# Patient Record
Sex: Female | Born: 1970 | Race: White | Hispanic: No | Marital: Married | Smoking: Never smoker
Health system: Southern US, Community
[De-identification: ages and names within clinical notes are randomized; demographics above are authoritative.]

## PROBLEM LIST (undated history)

## (undated) ENCOUNTER — Inpatient Hospital Stay (HOSPITAL_COMMUNITY): Payer: Self-pay

## (undated) DIAGNOSIS — E669 Obesity, unspecified: Secondary | ICD-10-CM

## (undated) DIAGNOSIS — Z8041 Family history of malignant neoplasm of ovary: Secondary | ICD-10-CM

## (undated) DIAGNOSIS — IMO0002 Reserved for concepts with insufficient information to code with codable children: Secondary | ICD-10-CM

## (undated) DIAGNOSIS — R87619 Unspecified abnormal cytological findings in specimens from cervix uteri: Secondary | ICD-10-CM

## (undated) HISTORY — PX: OTHER SURGICAL HISTORY: SHX169

## (undated) HISTORY — DX: Family history of malignant neoplasm of ovary: Z80.41

---

## 2003-01-13 ENCOUNTER — Other Ambulatory Visit: Admission: RE | Admit: 2003-01-13 | Discharge: 2003-01-13 | Payer: Self-pay | Admitting: Obstetrics & Gynecology

## 2005-05-14 ENCOUNTER — Encounter: Admission: RE | Admit: 2005-05-14 | Discharge: 2005-06-01 | Payer: Self-pay | Admitting: General Surgery

## 2005-05-14 ENCOUNTER — Ambulatory Visit (HOSPITAL_COMMUNITY): Admission: RE | Admit: 2005-05-14 | Discharge: 2005-05-14 | Payer: Self-pay | Admitting: General Surgery

## 2005-05-28 ENCOUNTER — Ambulatory Visit (HOSPITAL_COMMUNITY): Admission: RE | Admit: 2005-05-28 | Discharge: 2005-05-28 | Payer: Self-pay | Admitting: General Surgery

## 2005-06-02 HISTORY — PX: LAPAROSCOPIC GASTRIC BANDING: SHX1100

## 2005-09-26 ENCOUNTER — Encounter: Admission: RE | Admit: 2005-09-26 | Discharge: 2005-10-30 | Payer: Self-pay | Admitting: General Surgery

## 2005-10-13 ENCOUNTER — Ambulatory Visit (HOSPITAL_COMMUNITY): Admission: RE | Admit: 2005-10-13 | Discharge: 2005-10-14 | Payer: Self-pay | Admitting: General Surgery

## 2005-12-04 ENCOUNTER — Encounter: Admission: RE | Admit: 2005-12-04 | Discharge: 2006-01-15 | Payer: Self-pay | Admitting: General Surgery

## 2006-03-12 ENCOUNTER — Encounter: Admission: RE | Admit: 2006-03-12 | Discharge: 2006-06-10 | Payer: Self-pay | Admitting: General Surgery

## 2006-06-11 ENCOUNTER — Encounter: Admission: RE | Admit: 2006-06-11 | Discharge: 2006-09-09 | Payer: Self-pay | Admitting: General Surgery

## 2006-10-19 ENCOUNTER — Encounter: Admission: RE | Admit: 2006-10-19 | Discharge: 2006-10-19 | Payer: Self-pay | Admitting: General Surgery

## 2006-10-29 ENCOUNTER — Ambulatory Visit (HOSPITAL_COMMUNITY): Admission: RE | Admit: 2006-10-29 | Discharge: 2006-10-29 | Payer: Self-pay | Admitting: General Surgery

## 2007-06-03 HISTORY — PX: CHOLECYSTECTOMY: SHX55

## 2007-06-24 ENCOUNTER — Inpatient Hospital Stay (HOSPITAL_COMMUNITY): Admission: EM | Admit: 2007-06-24 | Discharge: 2007-06-27 | Payer: Self-pay | Admitting: Emergency Medicine

## 2007-06-25 ENCOUNTER — Encounter (INDEPENDENT_AMBULATORY_CARE_PROVIDER_SITE_OTHER): Payer: Self-pay | Admitting: General Surgery

## 2007-11-16 ENCOUNTER — Encounter: Admission: RE | Admit: 2007-11-16 | Discharge: 2007-11-16 | Payer: Self-pay | Admitting: Surgery

## 2010-10-15 NOTE — Op Note (Signed)
Lauren Austin, Lauren Austin               ACCOUNT NO.:  [id number]   MEDICAL RECORD NO.:  [id number]          PATIENT TYPE:  AMB   LOCATION:  DAY                          FACILITY:  [facility]   PHYSICIAN:  Sharlet Salina T. Hoxworth, M.D.DATE OF BIRTH:  May 25, 1971   DATE OF PROCEDURE:  10/29/2006  DATE OF DISCHARGE:                               OPERATIVE REPORT   PREOPERATIVE DIAGNOSIS:  Inability to access lap band port, apparent  flipped port.   POSTOPERATIVE DIAGNOSIS:  Ports apparently in good position.   SURGICAL PROCEDURES:  Exploration of lap band port.   SURGEON:  Lorne Skeens. Hoxworth, M.D.   ANESTHESIA:  General.   BRIEF HISTORY:  Lauren Austin is a 40 year old female status post lap  band placement for morbid obesity approximately eight months ago.  She  has had good weight loss with 80 pounds total weight loss so far.  However, on two previous visits to the office, I have been unable to  access her port after multiple attempts.  I obtained a KUB which appears  to show that the port has flipped 180 degrees facing posteriorly.  As I  am unable to access her port for fills, I have recommended exploration  in the operating room and repositioning of the port.   DESCRIPTION OF PROCEDURE:  The patient was brought to the operating room  and placed in a supine position on the operating table and general  anesthesia induced.  She received preoperative antibiotics.  The abdomen  was sterilely prepped and draped.  The correct patient and procedure  were verified.  The small transverse scar in the right mid abdomen was  excised and dissection carried down through the subcutaneous tissue  using cautery.  I could carry dissection directly down on the port which  was palpable.  The port was oriented facing a little toward the medial  abdomen rather than typically angled toward the lateral abdomen, but did  appear to be securely fastened to the anterior fascia and in an  essentially normal  anterior posterior orientation, again other than  being angled slightly medially.  It certainly did not, however, appear  to be flipped 180 degrees or be unstable in anyway.   The port was positioned under the somewhat medial aspect of the incision  and with a slight medial tilt.  I suspect that access from the lateral  aspect of the incision made the angle difficult resulting in it being  unable to access.  With this finding, I did not feel that any formal re-  suturing of the port to the abdominal wall was indicated.  As discussed  with the patient, I accessed the port in the operating room directly.  It contained 2 mL of fluid as expected and I added an additional 0.25 mL  to bring her up to 2.25 mL.  The wound was irrigated and hemostasis  assured.  The subcu was infiltrated with Marcaine.  The skin was closed  with staples.  Sponge and needle counts were correct.  The patient  tolerated the procedure well and was taken to the recovery room in  good  condition.     Lorne Skeens. Hoxworth, M.D.  Electronically Signed    BTH/MEDQ  D:  10/29/2006  T:  10/29/2006  Job:  [id number]

## 2010-10-15 NOTE — Op Note (Signed)
Lauren Austin               ACCOUNT NO.:  [id number]   MEDICAL RECORD NO.:  [id number]          PATIENT TYPE:  INP   LOCATION:  1525                         FACILITY:  [facility]   PHYSICIAN:  Sharlet Salina T. Austin, M.D.DATE OF BIRTH:  1970-12-24   DATE OF PROCEDURE:  06/25/2007  DATE OF DISCHARGE:                               OPERATIVE REPORT   PREOPERATIVE DIAGNOSIS:  Cholelithiasis and possible  choledocholithiasis.   POSTOPERATIVE DIAGNOSIS:  Cholelithiasis and choledocholithiasis.   SURGICAL PROCEDURES:  Laparoscopic cholecystectomy and balloon catheter  common bile duct exploration.   SURGEON:  Dr. Johna Sheriff   ASSISTANT:  Dr. Felicity Pellegrini   ANESTHESIA:  General.   BRIEF HISTORY:  Lauren Austin is a 40 year old female who is status post  lap band surgery with approximately 80 pounds of weight loss.  She  presents with a several-day history of epigastric severe abdominal pain,  and workup has included a gallbladder ultrasound showing multiple  gallstones and a borderline dilated common bile duct.  She has had  elevated LFTs with a bilirubin 5 on presentation and alk phos about 180.  After overnight hospitalization, her pain has resolved and bilirubin is  down to 3, although remainder of her other liver enzymes are about the  same.  I have recommended at this point proceeding with laparoscopic  cholecystectomy with intraoperative cholangiogram.  We discussed  possible management of common bile duct stones including pre or  postoperative ERCP and have elected to planned postoperative ERCP if she  has common bile duct stones that could not be removed laparoscopically.  The nature of the procedure, the risks of bleeding, infection, common  bile duct injury, bile leak, possibility for open procedure or  postoperative ERCP were discussed understood.  She is now brought to the  operating room for this procedure.   DESCRIPTION OF OPERATION:  The patient brought to the operating  room and  placed in supine position on the operating table, and general  orotracheal anesthesia was induced.  The abdomen was widely sterilely  prepped and draped.  She was already on IV antibiotics.  Lovenox 40 mg  was given subcutaneously.  Local anesthesia was used to the to  infiltrate the trocar sites after correct patient and procedure were  verified.  A 1 cm incision was made above the umbilicus and dissection  carried down the midline fascia which was sharply incised for 1 cm and  the peritoneum entered under direct vision.  Through a mattress sutures  of 0 Vicryl, the Hasson trocar was placed and pneumoperitoneum  established.  There were a few adhesions of omentum up to the anterior  abdominal wall that were taken down through a 5 mm trocar placed  laterally, and then an 11 mm trocar was placed in the subxiphoid area  and another 5 mm trocar laterally.  The gallbladder was slightly  distended with some mild chronic inflammation.  The fundus was grasped,  and omental adhesions were taken down off the gallbladder, working down  toward the infundibulum which was then grasped and retracted  inferolaterally.  Further fibrofatty tissue was stripped  off the neck of  the gallbladder toward the porta hepatis.  Peritoneum anterior and  posterior to Calot's triangle was dissected and Calot's triangle  thoroughly dissected.  The gallbladder was seen to taper down nicely to  a slightly dilated cystic duct.  The cystic artery was clearly  identified coursing upon the gallbladder wall and Calot's triangle was  divided between two proximal and distal clip.  This cystic gallbladder  junction was dissected 360 degrees and the cystic duct dissected out  over about 1.5 cm.  When the anatomy appeared clear, the cystic duct was  clipped at the gallbladder junction and operative cholangiogram obtained  through the cystic duct.  This showed good filling of a mildly dilated  common bile duct and  intrahepatic ducts, and there was flow down to the  ampulla but a fairly abrupt cut off of the meniscus and no flow into the  duodenum.  At this point, I elected to try to clear the common duct with  a balloon catheter.  A #4 Fogarty catheter was introduced through one of  the lateral trocars after removal of the cholangiocatheter was inserted  through the cystic duct and was able be passed easily down through the  common duct and ampulla.  The balloon was inflated and the catheter  gently retracted back, and I retrieved and intact several mm gallstone  through the cystic duct.  We made another pass in a similar fashion with  the Fogarty catheter, and no further stones or debris were retrieved.  Following this, the cholangiogram was repeated, and this time there was  free flow into the duodenum.  There appeared to be a little bit of a  filling defect right at the ampulla, but this was consistent possibly  with a little sludge or edematous mucosa and not a definite stone.  At  this point, the cholangiocath was removed, and the cystic duct was  triply clipped proximally and divided.  The gallbladder was then  dissected free from its bed using hook cautery, removed intact, placed  in a bag and brought out through the midline incision.  The right upper  quadrant was thoroughly irrigated and complete hemostasis assured.  Trocars removed under direct vision, all CO2 was evacuated.  Mattress  suture was secured at the supraumbilical incision.  Skin incisions were  closed with interrupted subcuticular Monocryl and Dermabond.  The  patient taken to the recovery room in good condition.      Lauren Austin, M.D.  Electronically Signed     BTH/MEDQ  D:  06/25/2007  T:  06/26/2007  Job:  [id number]

## 2010-10-15 NOTE — H&P (Signed)
NAMECRISTIE, Austin               ACCOUNT NO.:  [id number]   MEDICAL RECORD NO.:  [id number]          PATIENT TYPE:  INP   LOCATION:  1525                         FACILITY:  [facility]   PHYSICIAN:  Ardeth Sportsman, MD     DATE OF BIRTH:  22-Feb-1971   DATE OF ADMISSION:  06/24/2007  DATE OF DISCHARGE:                              HISTORY & PHYSICAL   PRIMARY CARE PHYSICIAN:  Dr. Lynett Grimes   SURGEON:  Lorne Skeens. Hoxworth, M.D.   GASTROENTEROLOGIST:  [facility]   CHIEF COMPLAINT:  Epigastric pain and jaundice.   HISTORY OF PRESENT ILLNESS:  [name] is a 40 year old morbidly obese  female who is status post laparoscopic-adjusted gastric banding by Dr.  Glenna Fellows on 13 Oct 2005.  He has been following her, and she  has had a pretty good course except for operation for a slightly  malpositioned band.  She has lost 70 pounds since surgery.  She denies  any fevers, chills, sweats or sick contacts.  However, she notes for the  past four days she has been having episodes where she started having  severe epigastric/subxiphoid pain that was rather burning and deep.  She  felt nauseated.  She almost dry heaved but has not thrown up.  She has  had similar episodes about three or four times in the past year that are  much milder than this.  The pain intensified, so she came to the  emergency room.  Evaluation was concerning for elevated bilirubin around  5.  Given her concerns, she was seen in evaluation for probable  choledocholithiasis secondary to gall stone pancreatitis.  Admission was  requested for Dr. Johna Sheriff.  He is not available, so he asked me to  evaluate the patient for him until he is more available in the next 24  hours.   The patient denies any sick contacts or travel history.  She otherwise  has pretty good exercise tolerance and denies any exertional chest pain  or shortness of breath.  The pain does not seem to be worse with  coughing.  No productive  sputum.   PAST MEDICAL HISTORY:  1. Morbid obesity.  2. 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  3. Hypertension.  4. Depression.  5. Mild anxiety.   PAST SURGICAL HISTORY:  1. Laparoscopic adjustable gastric banding on 10/13/2005.  2. Exploration of port site on 29 Oct 2006.   ALLERGIES:  NONE.   MEDICATIONS:  She seems to be on Wellbutrin as well as birth control  pills, Lisinopril, Zoloft and Wellbutrin.   SOCIAL HISTORY:  No tobacco, alcohol or drug use.  She is here today  with her mother.   FAMILY HISTORY:  Her father has hypertension and diabetes, but her  mother and sister are doing well, but they are otherwise healthy.   REVIEW OF SYSTEMS:  Noted per chart.  Otherwise, constitutional,  ophthamalogic, ENT, cardiac, respiratory, renal, endocrine, negative.  Musculoskeletal:  She has some mild joint pain that has not improved  since losing 70 pounds from her gastric bypass surgery.  GI:  She has  some mild dysphagia with her banding, but that is appropriate.  She has  not had to have any recent fills or change.  She feels full and that has  not changed recently.  She has not had some increased intake of food.  She has a bowel movement every day.  Hepatic:  She has noted her urine  has been getting a little darker.  No severe itching, mild jaundice.  Psych:  Mild depression, anxiety that have been well controlled and  stable.  No recent mood change or swings.  No hallucinations or  psychoses.  Neurologic & allergic are otherwise negative.   PHYSICAL EXAMINATION:  VITAL SIGNS:  Her vital signs are noted in the  chart, but they are within the normal range.  GENERAL:  She is a well-developed, well-nourished obese female, lying in  bed, comfortable, not frankly toxic.  PSYCH:  She is pleasant & at least average intelligence, no evidence of  dementia, delerium, psychosis, or paranoia.  She does not seem  particularly anxious or depressed.   NEUROLOGIC:  Cranial nerves II-XII are intact.  Hand grip is 5/5,  symmetrical.  There are no intension tremors.  Eyes:  Pupils equal,  round, reactive to light, extraocular movements are intact.  Sclera are  slightly icteric but not injected.  HEENT:  She is normocephalic.  Mucous membranes are moist.  Nasopharynx  and oropharynx are clear.  NECK:  Supple, no masses, trachea is midline.  HEART:  Regular rate and rhythm, no murmurs, rubs or gallops.  CHEST:  Clear to auscultation bilaterally.  No wheezes, rales rhonchi.  No pain to reverse sternal compression.  ABDOMEN:  Soft, her port can be felt.  It does not seem to be infected.  She has some mild xiphoid discomfort but not really significant  discomfort in the right upper quadrant, no classic Murphy sign.  MUSCULOSKELETAL:  Full range of motion in her shoulders, elbows, wrists  as well as hips, knees and ankles, and entire spine.  LYMPHS:  No head, neck, axillary or groin lymphadenopathy.  BREASTS AND RECTAL:  Deferred at this time.   LABORATORY DATA:  White count is 4.2, his neutrophils are 80%.  Her  hemoglobin is 13.3.  Comprehensive metabolic panel:  Her basic  electrolytes are in the normal range except for her total bilirubin was  5.3 and alkaline phosphatase was 183.  AST and ALT are 99 and 120,  slightly evaluated.  Her albumin is 3.2.  Lipase is slightly elevated at  67.  Urine pregnancy was negative.  Urinalysis was negative.  X-ray  shows no evidence of any pneumonia or effusions.  Her band seems to be  in good positioning.   ASSESSMENT/PLAN:  Morbidly obese female a year and a half status post  distal gastric banding with 70-pound weight loss with xiphoid pain, no  postprandial concerning for probable biliary component with evidence of  pancreatitis and probable choledocholithiasis.  Will admit to IV fluids.  1. Ultrasound documented presence of stones and perhaps a common bile      duct stone.  2. GI consultation for  elevated liver function tests, question of need      of ERCP.  Perhaps just observing and seeing if her numbers go down      tomorrow to avoid an ERCP versus intervening sooner if this      worsens.  She is not particularly toxic.  3. I will hold off on any antibiotics at  this time.  4. Pancreatitis .  NPO on IV fluids for now.  5. Dr. Johna Sheriff will follow up on patient.  6. Hypertension.  Will monitor for right now.  7. Depression stable, anxiety stable.      Ardeth Sportsman, MD  Electronically Signed     SCG/MEDQ  D:  06/24/2007  T:  06/24/2007  Job:  [id number]

## 2010-10-18 NOTE — Op Note (Signed)
NAMESIAN, ROCKERS               ACCOUNT NO.:  [id number]   MEDICAL RECORD NO.:  [id number]          PATIENT TYPE:  OIB   LOCATION:  1616                         FACILITY:  [facility]   PHYSICIAN:  Sharlet Salina T. Hoxworth, M.D.DATE OF BIRTH:  05-06-1971   DATE OF PROCEDURE:  10/13/2005  DATE OF DISCHARGE:                                 OPERATIVE REPORT   POSTOPERATIVE DIAGNOSIS:  Morbid obesity.   SURGICAL PROCEDURES:  Laparoscopic adjustable gastric band placement.   SURGEON:  Lorne Skeens. Hoxworth, M.D.   ASSISTANT:  Baruch Merl, M.D.   ANESTHESIA:  General.   BRIEF HISTORY:  Lauren Austin is a 40 year old female with progressive  morbid obesity, now with a BMI of approximately 59 and multiple  comorbidities.  She has unable to sustain nonsurgical weight loss and  following an extensive preoperative workup and discussion detailed  elsewhere, we have elected proceed with laparoscopic adjustable gastric band  placement.   DESCRIPTION OF PROCEDURE:  The patient was brought to the operating room and  placed in  supine position on the operating table and general endotracheal  anesthesia was induced.  She received preoperative IV antibiotics.  Lovenox  40 mg was given subcutaneously.  PAS were placed.  The abdomen was widely  sterilely prepped and draped.  Correct patient and procedure were verified.  Trocar sites were infiltrated with local anesthesia.  Access was obtained  through a 1-cm incision using the OptiVu trocar in the left upper quadrant  and pneumoperitoneum established.  Under direct vision, a camera port trocar  was placed just to the left of midline above the umbilicus.  There were  noted to be some adhesions of omentum to the anterior abdominal wall in the  upper abdomen, although the patient had no previous surgery.  These were  taken down easily with scissor dissection under direct vision.  Following  this, a 12-mm trocar was placed in the right upper abdomen just  below the  falciform ligament, an 11-mm trocar in the right upper mid-abdomen.  Through  a 5-mm site, the [facility] retractor was placed in the epigastrium and the left  lobe of liver elevated with very good exposure of the upper stomach and  hiatus.  A 5-mm trocar was placed in the left flank under the direct vision.  Following this, using cautery, the peritoneum at the angle of His was  incised, leaving some lateral attachments and used the finger dissector,  blunt dissection was carried down along the left crus toward the  retrogastric space without difficulty.  Following this, the gastrohepatic  omentum was divided in an avascular area and the right crus exposed and the  base of the right crus with crossing fat identified.  The retroperitoneum  was incised here with cautery and with careful blunt dissection, the finger  dissector was able to be easily passed in the retrogastric space and  deployed up through the previously dissected area at the angle of His  without any difficulty.  Following this, a 10 lap band system was introduced  into the abdomen, the tubing placed through the finger dissector and  brought  back around posterior to the stomach and the band brought back around  through the retrogastric space without any difficulty.  The calibration tube  was placed into the stomach and the tubing placed through the buckle in the  band and it was snapped into place and seemed not to be under any undue  tension.  The calibration tube was removed.  With the tubing held toward the  patient's feet, the fundus just below the angle of His was imbricated up  over the band to the small gastric pouch with 3 interrupted sutures  of 2-0  Ethibond.  The band was seen to rotate be in good position.  Following this,  the tubing was brought out through the right mid-abdominal trocar site, the  [facility] retractor was removed and all CO2 evacuated and trocars removed.  This incision was extended about 3 cm  laterally and the anterior fascia  exposed.  Four fixation sutures of 2-0 Prolene were placed, passed through  the port, which had been attached to the band tubing, the tubing introduced  into the abdomen and the port secured with these sutures.  The port was  noted to lie at about the midpoint of the incision about a centimeter  inferior to it.  Wounds were irrigated and subcu was closed at the port site  with running of 3-0 Vicryl and the skin closed with staples.  Sponge, needle  and instrument counts were correct.  Dry sterile dressing were applied and  the patient taken to the recovery room in good condition.      Lorne Skeens. Hoxworth, M.D.  Electronically Signed     BTH/MEDQ  D:  10/13/2005  T:  10/14/2005  Job:  [id number]

## 2010-10-18 NOTE — Discharge Summary (Signed)
NAMESAMARIYAH, COWLES               ACCOUNT NO.:  [id number]   MEDICAL RECORD NO.:  [id number]          PATIENT TYPE:  INP   LOCATION:  1525                         [facility]AC[facility]   PHYSICIAN:  Sharlet Salina T. Hoxworth, M.D.DATE OF BIRTH:  November 21, 1970   DATE OF ADMISSION:  06/24/2007  DATE OF DISCHARGE:  06/27/2007                               DISCHARGE SUMMARY   DISCHARGE DIAGNOSIS:  Cholelithiasis and choledocholithiasis.   OPERATIONS AND PROCEDURES:  Laparoscopic cholecystectomy with balloon  catheter, common bile duct exploration June 25, 2007.   HISTORY OF PRESENT ILLNESS:  Ms. Tildon Husky is a 40 year old female with a  history of lap band for morbid obesity and hypertension.  She has had  significant weight loss.  She now presents with persistent epigastric  abdominal pain, nausea and vomiting over several days.  She has no  history of any previous similar symptoms.   PAST MEDICAL HISTORY:  Significant for morbid obesity and hypertension.  Previous surgery includes lap band surgery.   MEDICATIONS ON ADMISSION:  Wellbutrin, birth control pills, lisinopril,  Zoloft, Wellbutrin.   ALLERGIES:  None.   Social history, family history, review of systems:  See detailed H&P.   PERTINENT PHYSICAL EXAMINATION:  GENERAL:  A mildly obese white female  in no acute distress.  ABDOMEN:  Showed well-healed incisions, mild  tenderness in the epigastrium.   LABORATORY:  Bilirubin was 5.3, alkaline phosphatase 183, AST, ALT 99  and 120 respectively.  Ultrasound showed gallstones, questionable common  bile duct stone.   HOSPITAL COURSE:  The patient was admitted, was started on pain  medicine, IV fluids.  On January 23, she was pain-free.  LFTs were down  somewhat.  Bilirubin 3, alkaline phosphatase 182, OT, PT 86, 134  respectively.  I recommended proceeding with laparoscopic  cholecystectomy with cholangiogram.  This was performed on January 23.  Cholangiogram showed a single distal common  bile duct stone, which was  able to be retrieved with a balloon catheter, and subsequent  cholangiogram showed no obstruction or stones.  Postoperatively she had  only mild surgical-type discomfort.  However, on January 24, LFTs were  increased, bilirubin 4.2, alk phosphatase 236, transaminases 133, 194  respectively.  As she was entirely asymptomatic, I elected to observe  this, and on the 25th, LFTs were somewhat improved, bilirubin 3.8,  alk  phos 264.  She was having no pain.  Tolerating a diet.  Abdomen  was soft and nontender.  With LFTs trending down, normal cholangiogram  and a clinically well patient, I elected to discharge her at this time  and follow LFTs in the office in 1 week.  She will call for any  recurrent pain.  Final pathology confirmed cholelithiasis and  cholecystitis.      Lorne Skeens. Hoxworth, M.D.  Electronically Signed     BTH/MEDQ  D:  07/15/2007  T:  07/16/2007  Job:  [id number]

## 2011-02-20 LAB — COMPREHENSIVE METABOLIC PANEL
ALT: 129 — ABNORMAL HIGH
ALT: 134 — ABNORMAL HIGH
AST: 86 — ABNORMAL HIGH
AST: 99 — ABNORMAL HIGH
Albumin: 2.8 — ABNORMAL LOW
Albumin: 3.2 — ABNORMAL LOW
Alkaline Phosphatase: 182 — ABNORMAL HIGH
Alkaline Phosphatase: 183 — ABNORMAL HIGH
BUN: 1 — ABNORMAL LOW
BUN: 5 — ABNORMAL LOW
CO2: 23
CO2: 28
Calcium: 8.6
Calcium: 9
Chloride: 104
Chloride: 104
Creatinine, Ser: 0.59
Creatinine, Ser: 0.66
GFR calc Af Amer: >60
GFR calc Af Amer: >60
GFR calc non Af Amer: >60
GFR calc non Af Amer: >60
Glucose, Bld: 107 — ABNORMAL HIGH
Glucose, Bld: 109 — ABNORMAL HIGH
Potassium: 4.2
Potassium: 4.3
Sodium: 135
Sodium: 138
Total Bilirubin: 3 — ABNORMAL HIGH
Total Bilirubin: 5.3 — ABNORMAL HIGH
Total Protein: 5.8 — ABNORMAL LOW
Total Protein: 6.5

## 2011-02-20 LAB — HEPATIC FUNCTION PANEL
ALT: 194 — ABNORMAL HIGH
AST: 133 — ABNORMAL HIGH
Albumin: 2.8 — ABNORMAL LOW
Alkaline Phosphatase: 236 — ABNORMAL HIGH
Alkaline Phosphatase: 264 — ABNORMAL HIGH
Bilirubin, Direct: 2.7 — ABNORMAL HIGH
Indirect Bilirubin: 1.5 — ABNORMAL HIGH
Indirect Bilirubin: 1.6 — ABNORMAL HIGH
Total Bilirubin: 4.2 — ABNORMAL HIGH
Total Protein: 6
Total Protein: 6

## 2011-02-20 LAB — CBC
HCT: 35.9 — ABNORMAL LOW
HCT: 35.9 — ABNORMAL LOW
HCT: 38.1
Hemoglobin: 12.5
Hemoglobin: 12.6
Hemoglobin: 13.3
MCHC: 34.9
MCHC: 34.9
MCHC: 35
MCV: 92
MCV: 92.5
Platelets: 172
Platelets: 179
RBC: 3.88
RBC: 4.15
RDW: 13.1
RDW: 13.2
RDW: 13.4
WBC: 3.2 — ABNORMAL LOW
WBC: 4.2

## 2011-02-20 LAB — URINALYSIS, ROUTINE W REFLEX MICROSCOPIC
Glucose, UA: NEGATIVE
Ketones, ur: 15 — AB
Leukocytes, UA: NEGATIVE
Nitrite: NEGATIVE
Protein, ur: NEGATIVE
Specific Gravity, Urine: 1.016
Urobilinogen, UA: 0.2
pH: 7.5

## 2011-02-20 LAB — DIFFERENTIAL
Basophils Absolute: 0
Lymphocytes Relative: 13
Lymphs Abs: 0.5 — ABNORMAL LOW
Monocytes Absolute: 0.3
Monocytes Relative: 7
Neutro Abs: 3.4

## 2011-02-20 LAB — LIPASE, BLOOD
Lipase: 26
Lipase: 67 — ABNORMAL HIGH

## 2011-02-20 LAB — URINE MICROSCOPIC-ADD ON

## 2011-03-06 ENCOUNTER — Telehealth (INDEPENDENT_AMBULATORY_CARE_PROVIDER_SITE_OTHER): Payer: Self-pay

## 2011-03-06 NOTE — Telephone Encounter (Signed)
I called patient to let her know Lauren Austin cancelled his afternoon office tomorrow 03/07/11. I left message to call back so I can move her up to morning slot or reschedule her for another day.

## 2011-03-21 ENCOUNTER — Encounter (INDEPENDENT_AMBULATORY_CARE_PROVIDER_SITE_OTHER): Payer: Self-pay

## 2011-03-21 ENCOUNTER — Ambulatory Visit (INDEPENDENT_AMBULATORY_CARE_PROVIDER_SITE_OTHER): Payer: Managed Care, Other (non HMO) | Admitting: Physician Assistant

## 2011-03-21 VITALS — BP 132/80 | HR 68 | Temp 97.2°F | Resp 14 | Ht 62.0 in | Wt 287.8 lb

## 2011-03-21 DIAGNOSIS — Z4651 Encounter for fitting and adjustment of gastric lap band: Secondary | ICD-10-CM

## 2011-03-21 NOTE — Progress Notes (Signed)
  HISTORY: Lauren Austin is a 40 y.o.female who received an 10cm lap-band in May 2007 by Dr. Johna Sheriff. She comes in complaining of increasing hunger and portion sizes. She denies vomiting or regurgitation symptoms. She would like to lose 40 lbs in the next 6 months.  VITAL SIGNS: Filed Vitals:   03/21/11 0909  BP: 132/80  Pulse: 68  Temp: 97.2 F (36.2 C)  Resp: 14    PHYSICAL EXAM: Physical exam reveals a very well-appearing 40 y.o.female in no apparent distress Neurologic: Awake, alert, oriented Psych: Bright affect, conversant Respiratory: Breathing even and unlabored. No stridor or wheezing Abdomen: Soft, nontender, nondistended to palpation. Incisions well-healed. No incisional hernias. Port easily palpated. Extremities: Atraumatic, good range of motion.  ASSESMENT: 40 y.o.  female  s/p 10cm lap-band.   PLAN: The patient's port was accessed with a 20G Huber needle without difficulty. Clear fluid was aspirated and 0.25 mL saline was added to the port to give a total predicted volume of 3.5 mL. The patient was able to swallow water without difficulty following the procedure and was instructed to take clear liquids for the next 24-48 hours and advance slowly as tolerated.

## 2011-03-21 NOTE — Patient Instructions (Signed)
Take clear liquids for the next 48 hours. Thin protein shakes are ok to start on Saturday evening. Call us if you have persistent vomiting or regurgitation, night cough or reflux symptoms. Return as scheduled or sooner if you notice no changes in hunger/portion sizes.   

## 2011-05-06 ENCOUNTER — Other Ambulatory Visit: Payer: Self-pay | Admitting: Obstetrics & Gynecology

## 2011-06-03 NOTE — L&D Delivery Note (Signed)
Patient was C/C/+2 and pushed to +3 for about 20 minutes with epidural.   FHTs began to have decels to 80-90s with every contraction and she had  one decel to 80s for about 4-5 minutes.   Once +3 station was achieved, I consented the pt for a vacuum assisted  delivery and she agreed.  VE placed and pulled to delivery in one ctx without popoffs. Rest of  female infant delivered, Apgars 9,9, weight P.  Cord gas was 7.26.   The patient had a third degree perineal  lacerations that extended to the L vaginal fornix. This was repaired with 2-0 vicryl R and the rectum checked and found to be intact. Fundus was firm. EBL was expected. Placenta was delivered intact. Vagina was clear.  Baby was vigorous to bedside.  Neo were called secondary meconium fluid seen while pushing; they evaluated the baby  and found him to be stable and healthy.  Chayse Zatarain A

## 2011-06-04 ENCOUNTER — Other Ambulatory Visit: Payer: Self-pay

## 2011-06-04 LAB — OB RESULTS CONSOLE RPR: RPR: NONREACTIVE

## 2011-06-04 LAB — OB RESULTS CONSOLE ABO/RH: RH Type: NEGATIVE

## 2011-06-09 ENCOUNTER — Other Ambulatory Visit (HOSPITAL_COMMUNITY): Payer: Self-pay | Admitting: Obstetrics & Gynecology

## 2011-06-09 DIAGNOSIS — O280 Abnormal hematological finding on antenatal screening of mother: Secondary | ICD-10-CM

## 2011-06-10 ENCOUNTER — Ambulatory Visit (HOSPITAL_COMMUNITY)
Admission: RE | Admit: 2011-06-10 | Discharge: 2011-06-10 | Disposition: A | Discharge status: Home / Self Care | Payer: Managed Care, Other (non HMO) | Source: Ambulatory Visit | Attending: Family Medicine | Admitting: Family Medicine

## 2011-06-10 ENCOUNTER — Encounter (HOSPITAL_COMMUNITY): Payer: Self-pay

## 2011-06-10 ENCOUNTER — Ambulatory Visit (HOSPITAL_COMMUNITY)
Admission: RE | Admit: 2011-06-10 | Discharge: 2011-06-10 | Disposition: A | Discharge status: Home / Self Care | Payer: Managed Care, Other (non HMO) | Source: Ambulatory Visit | Attending: Obstetrics & Gynecology | Admitting: Obstetrics & Gynecology

## 2011-06-10 ENCOUNTER — Other Ambulatory Visit: Payer: Self-pay

## 2011-06-10 DIAGNOSIS — O09519 Supervision of elderly primigravida, unspecified trimester: Secondary | ICD-10-CM

## 2011-06-10 DIAGNOSIS — Z538 Procedure and treatment not carried out for other reasons: Secondary | ICD-10-CM | POA: Insufficient documentation

## 2011-06-10 DIAGNOSIS — Z3689 Encounter for other specified antenatal screening: Secondary | ICD-10-CM | POA: Insufficient documentation

## 2011-06-10 DIAGNOSIS — O280 Abnormal hematological finding on antenatal screening of mother: Secondary | ICD-10-CM

## 2011-06-10 NOTE — Progress Notes (Signed)
Genetic Counseling  High-Risk Gestation Note  Appointment Date:  06/10/2011 Referred By: Mickel Baas, MD Date of Birth:  07-20-1970 Partner:  Raelyn Ensign  Pregnancy History: G1P0000 Estimated Date of Delivery: 12/20/11 Estimated Gestational Age: [redacted]w[redacted]d Attending: Berna Spare, MD  Mrs. Eli Phillips and her husband, Mr. Tangala Wiegert, were seen for genetic counseling regarding a maternal age of 41 y.o. and an increased risk for Down syndrome based on First trimester screening through [employer].  They were counseled regarding maternal age and the association with risk for chromosome conditions due to nondisjunction with aging of the ova.   We reviewed chromosomes, nondisjunction, and the associated 1 in 6 risk for fetal aneuploidy at [redacted]w[redacted]d related to a maternal age of 65 at delivery.  They were counseled that the risk for aneuploidy decreases as gestational age increases, accounting for those pregnancies which spontaneously abort.  We specifically discussed Down syndrome (trisomy 5), trisomies 1 and 33, and sex chromosome aneuploidies (47,XXX and 47,XXY) including the common features and prognoses of each.   We also reviewed Mrs. Quillin's first trimester screen result and the associated increase in risk for fetal Down syndrome (1 in 67 to 1 in 25).  They were counseled regarding other explanations for a screen positive result including normal variation and differences in maternal metabolism.  In addition, we reviewed the screen adjusted reduction in risk for trisomy 18 (1 in 272).  They understand that first trimester screening provides a pregnancy specific risk for these conditions, but is not considered to be diagnostic.    They were counseled regarding other available screening and diagnostic options including ultrasound, CVS, and amniocentesis.  The risks, benefits, and limitations of each of these options were reviewed in detail. They understand that screening tests cannot rule  out all birth defects or genetic syndromes.  They were counseled that 50-80% of fetuses with Down syndrome and up to 90% of fetuses with trisomies 13 and 18, when well visualized, have detectable anomalies or soft markers by ultrasound.  We discussed another type of screening test, noninvasive prenatal testing (NIPT), which utilizes cell free fetal DNA found in the maternal circulation. This test is not diagnostic for chromosome conditions, but can provide information regarding the presence or absence of extra fetal DNA for chromosomes 13, 18 and 21. The reported detection rate is greater than 99% for Trisomy 21, greater than 97% for Trisomy 18, and is approximately 80% for Trisomy 13. The false positive rate is thought to be less than 1% for any of these conditions. After consideration of all the options, and a clear understanding of the newness and limitations of NIPT, they elected to proceed with cell free fetal DNA testing at the time of today's visit and a second trimester targeted ultrasound, which was scheduled for 07/22/2011. Mrs. Chiles declined diagnostic testing (CVS and amniocentesis) given the associated risk of complications. Ultrasound was not performed at the time of today's visit.   Mrs. Manfre was provided with written information regarding cystic fibrosis (CF) including the carrier frequency and incidence in the Caucasian population, the availability of carrier testing and prenatal diagnosis if indicated.  In addition, we discussed that CF is routinely screened for as part of the [place] newborn screening panel.  She declined testing today.   Both family histories were reviewed and found to be contributory for learning disability in the patient's two nieces (her sister's daughters). The older niece was reportedly born at approximately [redacted] weeks gestation, is currently 40  years old and has blindness, cerebral palsy, and learning disability. The younger niece was reportedly born at [redacted] weeks gestation, has  hearing loss requiring hearing aids, and a learning disability and is currently age 83 years. The patient reported that there were possible drug exposure during both of these pregnancies also. We discussed that their features could be due to preterm delivery and/or environmental exposures during pregnancy. We discussed that there could also be an underlying genetic cause. Without further information regarding the provided family history, an accurate genetic risk cannot be calculated.Further genetic counseling is warranted if more information is obtained.   Additionally, the father of the pregnancy reported his maternal grandmother and her twin brother had albinism which reportedly affected hair, skin, and eye pigmentation as well as vision but did not affect their hearing. A specific type of albinism was not known. We discussed that there are different types of albinism, which can follow autosomal recessive or X-linked inheritance. We reviewed genes and these forms of inheritance. We discussed that autosomal recessive inheritance would be more likely to fit the reported family history. In the case of an underlying autosomal recessive form, Mr. Brzozowski would have a 1 in 2 chance to be a carrier, and recurrence risk to the pregnancy would depend upon Mrs. Lapp's chance to be a carrier. Mrs. Maultsby would have the general population chance, which would vary depending upon the specific type of albinism in Mr. Tancredi grandmother, given she has not known family history and consanguinity was denied. They understand that without an identified genetic cause, we are unable to screen or test for albinism prenatally. Further genetic counseling is warranted if more information is obtained regarding the family history.   Mrs. Kowalski denied exposure to environmental toxins or chemical agents. She denied the use of alcohol, tobacco or street drugs. She reported having a viral illness accompanied by a fever of 101.9 degrees Fahrenheit at the  time she discovered the pregnancy. Available animal and human study data on elevated maternal body temperature (hyperthermia) during pregnancy suggest an association with increased risk for birth defects, particularly open neural tube defects when the maternal body temperature is at or above 102 degrees F. Given the degree of Mrs. Tupy's fever, the chance for these particular type of birth defects would not likely be increased above the general population risk. However, we discussed available screening options in the pregnancy to screen for open neural tube defects, including AFP maternal serum screen and targeted ultrasound in the second trimester. Her medical and surgical histories were noncontributory.     I counseled this couple regarding the above risks and available options.  The approximate face-to-face time with the genetic counselor was 40 minutes.    Quinn Plowman, MS,  Certified Genetic Counselor 06/10/2011

## 2011-06-11 ENCOUNTER — Other Ambulatory Visit: Payer: Self-pay

## 2011-06-20 ENCOUNTER — Telehealth (HOSPITAL_COMMUNITY): Payer: Self-pay | Admitting: MS"

## 2011-06-20 NOTE — Telephone Encounter (Signed)
Called [name] to discuss result of cell free fetal DNA testing ([facility]). These results indicate a low risk (less than 1 in 10,000) for trisomy 21, trisomy 35, and trisomy 67. Limitations of this testing reviewed. Patient is happy with these results.

## 2011-07-21 ENCOUNTER — Ambulatory Visit (HOSPITAL_COMMUNITY)
Admission: RE | Admit: 2011-07-21 | Discharge: 2011-07-21 | Disposition: A | Discharge status: Home / Self Care | Payer: Managed Care, Other (non HMO) | Source: Ambulatory Visit | Attending: Obstetrics & Gynecology | Admitting: Obstetrics & Gynecology

## 2011-07-21 VITALS — BP 120/60 | HR 85 | Wt 285.0 lb

## 2011-07-21 DIAGNOSIS — O09519 Supervision of elderly primigravida, unspecified trimester: Secondary | ICD-10-CM

## 2011-07-21 DIAGNOSIS — O285 Abnormal chromosomal and genetic finding on antenatal screening of mother: Secondary | ICD-10-CM

## 2011-07-21 DIAGNOSIS — E669 Obesity, unspecified: Secondary | ICD-10-CM | POA: Insufficient documentation

## 2011-07-21 DIAGNOSIS — Z363 Encounter for antenatal screening for malformations: Secondary | ICD-10-CM | POA: Insufficient documentation

## 2011-07-21 DIAGNOSIS — Z1389 Encounter for screening for other disorder: Secondary | ICD-10-CM | POA: Insufficient documentation

## 2011-07-21 DIAGNOSIS — O358XX0 Maternal care for other (suspected) fetal abnormality and damage, not applicable or unspecified: Secondary | ICD-10-CM | POA: Insufficient documentation

## 2011-07-21 NOTE — Progress Notes (Signed)
Lauren Austin was seen for ultrasound appointment today.  Please see [facility] report for details.

## 2011-07-22 ENCOUNTER — Ambulatory Visit (HOSPITAL_COMMUNITY): Payer: Managed Care, Other (non HMO)

## 2011-09-01 ENCOUNTER — Ambulatory Visit (HOSPITAL_COMMUNITY)
Admission: RE | Admit: 2011-09-01 | Discharge: 2011-09-01 | Disposition: A | Discharge status: Home / Self Care | Payer: Managed Care, Other (non HMO) | Source: Ambulatory Visit | Attending: Obstetrics & Gynecology | Admitting: Obstetrics & Gynecology

## 2011-09-01 VITALS — BP 94/37 | HR 82 | Wt 288.0 lb

## 2011-09-01 DIAGNOSIS — Z363 Encounter for antenatal screening for malformations: Secondary | ICD-10-CM | POA: Insufficient documentation

## 2011-09-01 DIAGNOSIS — O285 Abnormal chromosomal and genetic finding on antenatal screening of mother: Secondary | ICD-10-CM

## 2011-09-01 DIAGNOSIS — O09519 Supervision of elderly primigravida, unspecified trimester: Secondary | ICD-10-CM | POA: Insufficient documentation

## 2011-09-01 DIAGNOSIS — O358XX0 Maternal care for other (suspected) fetal abnormality and damage, not applicable or unspecified: Secondary | ICD-10-CM | POA: Insufficient documentation

## 2011-09-01 DIAGNOSIS — E669 Obesity, unspecified: Secondary | ICD-10-CM

## 2011-09-01 DIAGNOSIS — Z1389 Encounter for screening for other disorder: Secondary | ICD-10-CM | POA: Insufficient documentation

## 2011-10-07 DIAGNOSIS — IMO0002 Reserved for concepts with insufficient information to code with codable children: Secondary | ICD-10-CM

## 2011-10-07 DIAGNOSIS — Z331 Pregnant state, incidental: Secondary | ICD-10-CM

## 2011-10-08 ENCOUNTER — Inpatient Hospital Stay (HOSPITAL_COMMUNITY)
Admission: AD | Admit: 2011-10-08 | Discharge: 2011-10-08 | Disposition: A | Discharge status: Home / Self Care | Payer: Managed Care, Other (non HMO) | Source: Ambulatory Visit | Attending: Obstetrics and Gynecology | Admitting: Obstetrics and Gynecology

## 2011-10-08 ENCOUNTER — Encounter (HOSPITAL_COMMUNITY): Payer: Self-pay | Admitting: *Deleted

## 2011-10-08 DIAGNOSIS — S30814A Abrasion of vagina and vulva, initial encounter: Secondary | ICD-10-CM

## 2011-10-08 DIAGNOSIS — O99891 Other specified diseases and conditions complicating pregnancy: Secondary | ICD-10-CM | POA: Insufficient documentation

## 2011-10-08 DIAGNOSIS — IMO0002 Reserved for concepts with insufficient information to code with codable children: Secondary | ICD-10-CM | POA: Insufficient documentation

## 2011-10-08 DIAGNOSIS — Z331 Pregnant state, incidental: Secondary | ICD-10-CM

## 2011-10-08 DIAGNOSIS — O4690 Antepartum hemorrhage, unspecified, unspecified trimester: Secondary | ICD-10-CM | POA: Insufficient documentation

## 2011-10-08 HISTORY — DX: Reserved for concepts with insufficient information to code with codable children: IMO0002

## 2011-10-08 HISTORY — DX: Obesity, unspecified: E66.9

## 2011-10-08 HISTORY — DX: Unspecified abnormal cytological findings in specimens from cervix uteri: R87.619

## 2011-10-08 LAB — URINE MICROSCOPIC-ADD ON

## 2011-10-08 LAB — URINALYSIS, ROUTINE W REFLEX MICROSCOPIC
Nitrite: NEGATIVE
Protein, ur: NEGATIVE mg/dL
Specific Gravity, Urine: 1.02 (ref 1.005–1.030)
Urobilinogen, UA: 1 mg/dL (ref 0.0–1.0)

## 2011-10-08 NOTE — MAU Note (Signed)
States she and her husband were attempting to have intercourse. States they were unsuccessful because "it was hurting too bad." Noted after that that she was having some light red bleeding, small amount. No clots. States still feels tender in vaginal area, but denies other pain.

## 2011-10-08 NOTE — Discharge Instructions (Signed)
Vaginal Laceration You have a laceration of the vagina. A laceration is a cut or tear. Small tears usually do not need stitches. Your caregiver may have to repair the tear with stitches for you. This brings the skin edges (margins) together and allows the cut to heal faster. Your caregiver will instruct you as to whether stitch (suture) removal is necessary. Some sutures are absorbed by the body without removal. Some vaginal lacerations can be very serious because of heavy bleeding. Therefore, medical evaluation is necessary right away. CAUSES   A controlled cutting of the vagina when delivering a baby (episiotomy).   Delivering a baby without an episiotomy.   Trauma caused by a bicycle or car accident.   Domestic violence and/or rape.  HOME CARE INSTRUCTIONS   Only take over-the-counter or prescription medicines for pain, discomfort, or fever as directed by your caregiver. Do not use aspirin because it can increase bleeding problems.   Your caregiver may prescribe medicines that kill germs (antibiotics) or recommend a tetanus shot.   You may resume normal diet and activities as directed.   Do not douche, use tampons or have intercourse until your caregiver has given permission.   A bandage (dressing) may have been applied. This may be changed once per day or as instructed. If the dressing sticks, it may be soaked off with soapy water or hydrogen peroxide.   Warm sitz baths 2 to 3 times a day may help any discomfort and swelling of the laceration.  You might need a tetanus shot now if:  You have no idea when you had the last one.   You have never had a tetanus shot before.   Your cut had dirt in it.  If you need a tetanus shot, and you decide not to get one, there is a rare chance of getting tetanus. Sickness from tetanus can be serious. If you got a tetanus shot, your arm may swell, get red and warm to the touch at the shot site. This is common and not a problem. SEEK MEDICAL CARE  IF:   You develop a rash.   You have problems with the medications.  SEEK IMMEDIATE MEDICAL CARE IF:   There is redness, swelling, or increasing pain in the vaginal area.   Pus is coming from the wound or vagina.   An unexplained oral temperature above 102 F (38.9 C) develops.   You notice a bad smell coming from the vagina. This may be a sign of infection.   There is a breaking open of a wound (edges not staying together).   You develop increasing belly (abdominal) pain.   There is a lot of (excessive) vaginal bleeding.   There is pain with intercourse after the laceration heals. Scar tissue may have developed.  Document Released: 05/19/2005 Document Revised: 05/08/2011 Document Reviewed: 12/12/2008 [employer] Patient Information 2012 [employer]. 

## 2011-10-08 NOTE — MAU Provider Note (Signed)
Lauren Austin is a 41 y.o. female @ [redacted]w[redacted]d gestation who presents to MAU for vaginal pain and bleeding. Patient states that she and her husband were trying to have sexual intercourse when she had sudden pain that caused them to stop. Then her husband noted that the patient had vaginal bleeding.   VS: T 97.9, P 86, BP 111/65  Exam:  External genitalia with approximately 1.5 cm. laceration to the mucous membrane area of the right labia. Bleeding controlled.   Assessment: Laceration to labia  Plan: Discussed with Dr. Tenny Craw and she will be in to evaluate the patient.  Medical screening exam completed and patient is stable to await private OB.  8:29 AM  Superficial right labial abrasion, hemostatic. No need for suture.   Discussed with Dr. Tenny Craw, agrees with supportive treatment.   Assessment/Plan:  Right Labial Abrasion  [redacted]w[redacted]d Gestation  Pelvic rest til healed 1-2 weeks Peri-bottle and sitz baths reviewed FU at [facility] as scheduled  Zacharias Ridling E. 10/08/2011 8:31 AM

## 2011-10-13 ENCOUNTER — Ambulatory Visit (HOSPITAL_COMMUNITY)
Admission: RE | Admit: 2011-10-13 | Discharge: 2011-10-13 | Disposition: A | Discharge status: Home / Self Care | Payer: Managed Care, Other (non HMO) | Source: Ambulatory Visit | Attending: Obstetrics & Gynecology | Admitting: Obstetrics & Gynecology

## 2011-10-13 VITALS — BP 124/58 | HR 82 | Wt 293.0 lb

## 2011-10-13 DIAGNOSIS — O09519 Supervision of elderly primigravida, unspecified trimester: Secondary | ICD-10-CM | POA: Insufficient documentation

## 2011-10-13 DIAGNOSIS — O285 Abnormal chromosomal and genetic finding on antenatal screening of mother: Secondary | ICD-10-CM

## 2011-10-13 DIAGNOSIS — E669 Obesity, unspecified: Secondary | ICD-10-CM

## 2011-10-13 DIAGNOSIS — O99210 Obesity complicating pregnancy, unspecified trimester: Secondary | ICD-10-CM | POA: Insufficient documentation

## 2011-11-10 ENCOUNTER — Ambulatory Visit (HOSPITAL_COMMUNITY): Payer: Managed Care, Other (non HMO)

## 2011-12-05 ENCOUNTER — Inpatient Hospital Stay (HOSPITAL_COMMUNITY)
Admission: AD | Admit: 2011-12-05 | Discharge: 2011-12-07 | DRG: 775 | Disposition: A | Discharge status: Home / Self Care | Payer: Managed Care, Other (non HMO) | Source: Ambulatory Visit | Attending: Obstetrics and Gynecology | Admitting: Obstetrics and Gynecology

## 2011-12-05 ENCOUNTER — Encounter (HOSPITAL_COMMUNITY): Payer: Self-pay | Admitting: *Deleted

## 2011-12-05 ENCOUNTER — Encounter (HOSPITAL_COMMUNITY): Payer: Self-pay | Admitting: Anesthesiology

## 2011-12-05 ENCOUNTER — Inpatient Hospital Stay (HOSPITAL_COMMUNITY): Payer: Managed Care, Other (non HMO) | Admitting: Anesthesiology

## 2011-12-05 ENCOUNTER — Inpatient Hospital Stay (HOSPITAL_COMMUNITY): Payer: Managed Care, Other (non HMO)

## 2011-12-05 DIAGNOSIS — Z22330 Carrier of Group B streptococcus: Secondary | ICD-10-CM

## 2011-12-05 DIAGNOSIS — IMO0001 Reserved for inherently not codable concepts without codable children: Secondary | ICD-10-CM

## 2011-12-05 DIAGNOSIS — O99844 Bariatric surgery status complicating childbirth: Secondary | ICD-10-CM | POA: Diagnosis present

## 2011-12-05 DIAGNOSIS — O99892 Other specified diseases and conditions complicating childbirth: Secondary | ICD-10-CM | POA: Diagnosis present

## 2011-12-05 DIAGNOSIS — O09519 Supervision of elderly primigravida, unspecified trimester: Secondary | ICD-10-CM | POA: Diagnosis present

## 2011-12-05 DIAGNOSIS — O368190 Decreased fetal movements, unspecified trimester, not applicable or unspecified: Secondary | ICD-10-CM | POA: Diagnosis present

## 2011-12-05 LAB — CBC
Hemoglobin: 12.4 g/dL (ref 12.0–15.0)
MCH: 32.2 pg (ref 26.0–34.0)
Platelets: 183 10*3/uL (ref 150–400)
RBC: 3.85 MIL/uL — ABNORMAL LOW (ref 3.87–5.11)
WBC: 14.4 10*3/uL — ABNORMAL HIGH (ref 4.0–10.5)

## 2011-12-05 LAB — TYPE AND SCREEN
ABO/RH(D): O NEG
Antibody Screen: NEGATIVE

## 2011-12-05 MED ORDER — DIPHENHYDRAMINE HCL 50 MG/ML IJ SOLN: 12.5000 mg | INTRAMUSCULAR | Status: DC | PRN

## 2011-12-05 MED ORDER — EPHEDRINE 5 MG/ML INJ: 10.0000 mg | INTRAVENOUS | Status: DC | PRN

## 2011-12-05 MED ORDER — IBUPROFEN 600 MG PO TABS: 600.0000 mg | ORAL_TABLET | Freq: Four times a day (QID) | ORAL | Status: DC | PRN

## 2011-12-05 MED ORDER — FENTANYL 2.5 MCG/ML BUPIVACAINE 1/10 % EPIDURAL INFUSION (WH - ANES)
14.0000 mL/h | INTRAMUSCULAR | Status: DC
  Administered 2011-12-05: 14 mL/h via EPIDURAL
  Filled 2011-12-05 (×2): qty 60

## 2011-12-05 MED ORDER — NALBUPHINE SYRINGE 5 MG/0.5 ML
5.0000 mg | INJECTION | INTRAMUSCULAR | Status: DC | PRN
  Administered 2011-12-05: 5 mg via INTRAVENOUS
  Filled 2011-12-05: qty 0.5

## 2011-12-05 MED ORDER — ONDANSETRON HCL 4 MG/2ML IJ SOLN: 4.0000 mg | Freq: Four times a day (QID) | INTRAMUSCULAR | Status: DC | PRN

## 2011-12-05 MED ORDER — EPHEDRINE 5 MG/ML INJ
10.0000 mg | INTRAVENOUS | Status: DC | PRN
  Filled 2011-12-05: qty 4

## 2011-12-05 MED ORDER — LACTATED RINGERS IV SOLN
500.0000 mL | Freq: Once | INTRAVENOUS | Status: AC
  Administered 2011-12-05: 500 mL via INTRAVENOUS

## 2011-12-05 MED ORDER — PHENYLEPHRINE 40 MCG/ML (10ML) SYRINGE FOR IV PUSH (FOR BLOOD PRESSURE SUPPORT)
80.0000 ug | PREFILLED_SYRINGE | INTRAVENOUS | Status: DC | PRN
  Filled 2011-12-05: qty 5

## 2011-12-05 MED ORDER — OXYTOCIN BOLUS FROM INFUSION
250.0000 mL | Freq: Once | INTRAVENOUS | Status: AC
  Administered 2011-12-05: 250 mL via INTRAVENOUS
  Filled 2011-12-05: qty 500

## 2011-12-05 MED ORDER — PENICILLIN G POTASSIUM 5000000 UNITS IJ SOLR
5.0000 10*6.[IU] | Freq: Once | INTRAMUSCULAR | Status: AC
  Administered 2011-12-05: 5 10*6.[IU] via INTRAVENOUS
  Filled 2011-12-05: qty 5

## 2011-12-05 MED ORDER — OXYTOCIN 40 UNITS IN LACTATED RINGERS INFUSION - SIMPLE MED
1.0000 m[IU]/min | INTRAVENOUS | Status: DC
  Administered 2011-12-05: 2 m[IU]/min via INTRAVENOUS
  Filled 2011-12-05: qty 1000

## 2011-12-05 MED ORDER — TERBUTALINE SULFATE 1 MG/ML IJ SOLN: 0.2500 mg | Freq: Once | INTRAMUSCULAR | Status: AC | PRN

## 2011-12-05 MED ORDER — CITRIC ACID-SODIUM CITRATE 334-500 MG/5ML PO SOLN: 30.0000 mL | ORAL | Status: DC | PRN

## 2011-12-05 MED ORDER — LACTATED RINGERS IV SOLN
500.0000 mL | INTRAVENOUS | Status: DC | PRN
  Administered 2011-12-05: 500 mL via INTRAVENOUS
  Administered 2011-12-05: 1000 mL via INTRAVENOUS

## 2011-12-05 MED ORDER — OXYCODONE-ACETAMINOPHEN 5-325 MG PO TABS: 1.0000 | ORAL_TABLET | ORAL | Status: DC | PRN

## 2011-12-05 MED ORDER — FENTANYL 2.5 MCG/ML BUPIVACAINE 1/10 % EPIDURAL INFUSION (WH - ANES)
INTRAMUSCULAR | Status: DC | PRN
  Administered 2011-12-05: 14 mL/h via EPIDURAL

## 2011-12-05 MED ORDER — FLEET ENEMA 7-19 GM/118ML RE ENEM: 1.0000 | ENEMA | RECTAL | Status: DC | PRN

## 2011-12-05 MED ORDER — LIDOCAINE HCL (PF) 1 % IJ SOLN
INTRAMUSCULAR | Status: DC | PRN
  Administered 2011-12-05 (×2): 8 mL

## 2011-12-05 MED ORDER — LIDOCAINE HCL (PF) 1 % IJ SOLN
30.0000 mL | INTRAMUSCULAR | Status: DC | PRN
  Administered 2011-12-05: 30 mL via SUBCUTANEOUS
  Filled 2011-12-05: qty 30

## 2011-12-05 MED ORDER — PHENYLEPHRINE 40 MCG/ML (10ML) SYRINGE FOR IV PUSH (FOR BLOOD PRESSURE SUPPORT): 80.0000 ug | PREFILLED_SYRINGE | INTRAVENOUS | Status: DC | PRN

## 2011-12-05 MED ORDER — LACTATED RINGERS IV SOLN
INTRAVENOUS | Status: DC
  Administered 2011-12-05 (×3): via INTRAVENOUS

## 2011-12-05 MED ORDER — ACETAMINOPHEN 325 MG PO TABS: 650.0000 mg | ORAL_TABLET | ORAL | Status: DC | PRN

## 2011-12-05 MED ORDER — OXYTOCIN 40 UNITS IN LACTATED RINGERS INFUSION - SIMPLE MED: 62.5000 mL/h | Freq: Once | INTRAVENOUS | Status: DC

## 2011-12-05 MED ORDER — PENICILLIN G POTASSIUM 5000000 UNITS IJ SOLR
2.5000 10*6.[IU] | INTRAVENOUS | Status: DC
  Administered 2011-12-05: 2.5 10*6.[IU] via INTRAVENOUS
  Filled 2011-12-05 (×6): qty 2.5

## 2011-12-05 NOTE — Anesthesia Procedure Notes (Signed)
Epidural Patient location during procedure: OB Start time: 12/05/2011 5:30 PM End time: 12/05/2011 5:34 PM Reason for block: procedure for pain  Staffing Anesthesiologist: Sandrea Hughs Performed by: anesthesiologist   Preanesthetic Checklist Completed: patient identified, site marked, surgical consent, pre-op evaluation, timeout performed, IV checked, risks and benefits discussed and monitors and equipment checked  Epidural Patient position: sitting Prep: site prepped and draped and DuraPrep Patient monitoring: continuous pulse ox and blood pressure Approach: midline Injection technique: LOR air  Needle:  Needle type: Tuohy  Needle gauge: 17 G Needle length: 9 cm Needle insertion depth: 8 cm Catheter type: closed end flexible Catheter size: 19 Gauge Catheter at skin depth: 13 cm Test dose: negative and Other  Assessment Sensory level: T10 Events: blood not aspirated, injection not painful, no injection resistance, negative IV test and no paresthesia

## 2011-12-05 NOTE — Progress Notes (Signed)
Pt admitted to L&D room 174

## 2011-12-05 NOTE — MAU Note (Signed)
Patient states she has not felt the baby move at all today, usually active. Fetal heart rate in triage 135-140 with an irregular rhythm heard x 3 times. Patient states she has some mild contractions, no bleeding or leaking and some mucus.

## 2011-12-05 NOTE — H&P (Signed)
41 y.o. [redacted]w[redacted]d  G1P0000 comes in c/o decreased fetal movement and CTXes.  Otherwise has good fetal movement and no bleeding.    Past Medical History  Diagnosis Date  . Family history of ovarian cancer   . Abnormal Pap smear   . Obesity     Past Surgical History  Procedure Date  . Laparoscopic gastric banding 2007  . Cholecystectomy 2009  . Lap band     OB History    Grav Para Term Preterm Abortions TAB SAB Ect Mult Living   1 0 0 0 0 0 0 0 0 0      # Outc Date GA Lbr Len/2nd Wgt Sex Del Anes PTL Lv   1 CUR               History   Social History  . Marital Status: Married    Spouse Name: N/A    Number of Children: N/A  . Years of Education: N/A   Occupational History  . Not on file.   Social History Main Topics  . Smoking status: Never Smoker   . Smokeless tobacco: Never Used  . Alcohol Use: Yes     occasional  . Drug Use: No  . Sexually Active: Yes   Other Topics Concern  . Not on file   Social History Narrative  . No narrative on file   Review of patient's allergies indicates no known allergies.   Prenatal Course:   1. AMA- DSR 1:25, amnion declined but MaterniT 21 test negative. 2. Passed 3 hr gtt. 3. MFM did complete anatomic survey and reported normal.  Filed Vitals:   12/05/11 1821  BP: 127/64  Pulse: 80  Temp:   Resp: 20     Lungs/Cor:  NAD Abdomen:  soft, gravid Ex:  no cords, erythema SVE:  4/90/-2 FHTs:  120, good STV, NST R; now mild variables with most contractions with quick return to baseline. Toco:  q3-5  BPP 6/8, AFI 16.  A/P  Term labor.  GBS pos- pcn  Ladye Macnaughton A

## 2011-12-05 NOTE — MAU Provider Note (Signed)
Chief Complaint:  Decreased Fetal Movement   First Provider Initiated Contact with Patient 12/05/11 1402      HPI  Lauren Austin is a 41 y.o. G1P0000 at [redacted]w[redacted]d presenting with no fetal movement for several hours today.  She reports she was at work, so drank some orange juice and sat quietly, but was unable to lie down to feel movement.  She also reports some pink spotting when wiping earlier today.  She came in to MAU reporting mild cramping but reports and increase in discomfort and more frequent contractions during her stay.  he has been feeling some movement since coming to MAU.  She denies LOF, vaginal itching/burning, urinary symptoms, h/a, dizziness, n/v, or fever/chills.     Pregnancy Course: uncomplicated  Past Medical History: Past Medical History  Diagnosis Date  . Family history of ovarian cancer   . Abnormal Pap smear   . Obesity     Past Surgical History: Past Surgical History  Procedure Date  . Laparoscopic gastric banding 2007  . Cholecystectomy 2009  . Lap band     Family History: Family History  Problem Relation Age of Onset  . Cancer Maternal Grandfather     bone    Social History: History  Substance Use Topics  . Smoking status: Never Smoker   . Smokeless tobacco: Never Used  . Alcohol Use: Yes     occasional    Allergies: No Known Allergies  Meds:  Prescriptions prior to admission  Medication Sig Dispense Refill  . docusate sodium (COLACE) 100 MG capsule Take 100 mg by mouth 2 (two) times daily. For hard stools or constipation      . Prenatal Vit-Fe Fumarate-FA (PRENATAL MULTIVITAMIN) TABS Take 1 tablet by mouth at bedtime.          Physical Exam  Blood pressure 120/70, pulse 88, temperature 98.4 F (36.9 C), temperature source Oral, resp. rate 20, height 5\' 2"  (1.575 m), weight 132.904 kg (293 lb), last menstrual period 03/08/2011. GENERAL: Well-developed, well-nourished female in no acute distress.  HEENT: normocephalic, good  dentition HEART: normal rate RESP: normal effort ABDOMEN: Soft, nontender, gravid appropriate for gestational age EXTREMITIES: Nontender, no edema NEURO: alert and oriented  Dilation: 4 Effacement (%): 90 Cervical Position: Middle Exam by: Lauren Austin CNM  FHT:  Baseline 130 , moderate variability with periods of minimal variability, accelerations present, no decelerations Contractions: q 1-2 mins lasting 30-60 sec   Imaging:  BPP 6/8 with zero scored for lack of sustained fetal breathing AFI 16+  Assessment: Active labor Decreased fetal movement GBS positive per pt  Plan: Discussed assessment and findings with Dr Henderson Cloud Admit to birthing suites May have epidural upon request PCN for GBS prophylaxis Care assumed by Dr Harrel Carina, [name] 7/5/20133:41 PM

## 2011-12-05 NOTE — Anesthesia Preprocedure Evaluation (Signed)
Anesthesia Evaluation  Patient identified by MRN, date of birth, ID band Patient awake    Reviewed: Allergy & Precautions, H&P , NPO status , Patient's Chart, lab work & pertinent test results  Airway Mallampati: III TM Distance: >3 FB Neck ROM: full    Dental No notable dental hx.    Pulmonary neg pulmonary ROS,  breath sounds clear to auscultation  Pulmonary exam normal       Cardiovascular negative cardio ROS      Neuro/Psych negative neurological ROS  negative psych ROS   GI/Hepatic negative GI ROS, Neg liver ROS,   Endo/Other  Morbid obesity  Renal/GU negative Renal ROS  negative genitourinary   Musculoskeletal negative musculoskeletal ROS (+)   Abdominal (+) + obese,   Peds negative pediatric ROS (+)  Hematology negative hematology ROS (+)   Anesthesia Other Findings   Reproductive/Obstetrics (+) Pregnancy                           Anesthesia Physical Anesthesia Plan  ASA: III  Anesthesia Plan: Epidural   Post-op Pain Management:    Induction:   Airway Management Planned:   Additional Equipment:   Intra-op Plan:   Post-operative Plan:   Informed Consent: I have reviewed the patients History and Physical, chart, labs and discussed the procedure including the risks, benefits and alternatives for the proposed anesthesia with the patient or authorized representative who has indicated his/her understanding and acceptance.     Plan Discussed with:   Anesthesia Plan Comments:         Anesthesia Quick Evaluation  

## 2011-12-05 NOTE — Consult Note (Signed)
Neonatology Note:  Attendance at Delivery:  I was asked to attend this NSVD at 37 6/[redacted] weeks GA due to terminal meconium. The mother is a G1P0 O neg, GBS pos. Mother received Pen G > 4 hours prior to delivery. ROM 5.5 hours prior to delivery, fluid clear until just before delivery, when meconium was noted. Infant vigorous with good spontaneous cry and tone. Our team arrived at about 2-3 minutes of life, at which time the baby was on the mother's abdomen, with good tone, color, and cry. Needed only minimal bulb suctioning for clear secretions. Ap 9/9. Lungs clear to ausc in DR. To CN to care of Pediatrician.  Richerd Grime, MD  

## 2011-12-06 ENCOUNTER — Encounter (HOSPITAL_COMMUNITY): Payer: Self-pay | Admitting: Obstetrics

## 2011-12-06 LAB — RPR: RPR Ser Ql: NONREACTIVE

## 2011-12-06 LAB — CBC
MCV: 94 fL (ref 78.0–100.0)
Platelets: 169 10*3/uL (ref 150–400)
RDW: 13.6 % (ref 11.5–15.5)
WBC: 18 10*3/uL — ABNORMAL HIGH (ref 4.0–10.5)

## 2011-12-06 MED ORDER — PRENATAL MULTIVITAMIN CH
1.0000 | ORAL_TABLET | Freq: Every day | ORAL | Status: DC
  Administered 2011-12-06 – 2011-12-07 (×2): 1 via ORAL
  Filled 2011-12-06 (×2): qty 1

## 2011-12-06 MED ORDER — METHYLERGONOVINE MALEATE 0.2 MG PO TABS: 0.2000 mg | ORAL_TABLET | ORAL | Status: DC | PRN

## 2011-12-06 MED ORDER — SODIUM CHLORIDE 0.9 % IJ SOLN: 3.0000 mL | INTRAMUSCULAR | Status: DC | PRN

## 2011-12-06 MED ORDER — ONDANSETRON HCL 4 MG/2ML IJ SOLN: 4.0000 mg | INTRAMUSCULAR | Status: DC | PRN

## 2011-12-06 MED ORDER — ONDANSETRON HCL 4 MG PO TABS: 4.0000 mg | ORAL_TABLET | ORAL | Status: DC | PRN

## 2011-12-06 MED ORDER — LANOLIN HYDROUS EX OINT: TOPICAL_OINTMENT | CUTANEOUS | Status: DC | PRN

## 2011-12-06 MED ORDER — METHYLERGONOVINE MALEATE 0.2 MG/ML IJ SOLN: 0.2000 mg | INTRAMUSCULAR | Status: DC | PRN

## 2011-12-06 MED ORDER — TETANUS-DIPHTH-ACELL PERTUSSIS 5-2.5-18.5 LF-MCG/0.5 IM SUSP
0.5000 mL | Freq: Once | INTRAMUSCULAR | Status: AC
  Administered 2011-12-07: 0.5 mL via INTRAMUSCULAR
  Filled 2011-12-06: qty 0.5

## 2011-12-06 MED ORDER — IBUPROFEN 800 MG PO TABS
800.0000 mg | ORAL_TABLET | Freq: Three times a day (TID) | ORAL | Status: DC
  Administered 2011-12-06 – 2011-12-07 (×4): 800 mg via ORAL
  Filled 2011-12-06 (×4): qty 1

## 2011-12-06 MED ORDER — BENZOCAINE-MENTHOL 20-0.5 % EX AERO
1.0000 "application " | INHALATION_SPRAY | CUTANEOUS | Status: DC | PRN
  Filled 2011-12-06 (×2): qty 56

## 2011-12-06 MED ORDER — MAGNESIUM HYDROXIDE 400 MG/5ML PO SUSP: 30.0000 mL | ORAL | Status: DC | PRN

## 2011-12-06 MED ORDER — OXYCODONE-ACETAMINOPHEN 5-325 MG PO TABS
1.0000 | ORAL_TABLET | ORAL | Status: DC | PRN
  Administered 2011-12-06: 2 via ORAL
  Filled 2011-12-06: qty 2

## 2011-12-06 MED ORDER — WITCH HAZEL-GLYCERIN EX PADS: 1.0000 "application " | MEDICATED_PAD | CUTANEOUS | Status: DC | PRN

## 2011-12-06 MED ORDER — ZOLPIDEM TARTRATE 5 MG PO TABS: 5.0000 mg | ORAL_TABLET | Freq: Every evening | ORAL | Status: DC | PRN

## 2011-12-06 MED ORDER — DIPHENHYDRAMINE HCL 25 MG PO CAPS: 25.0000 mg | ORAL_CAPSULE | Freq: Four times a day (QID) | ORAL | Status: DC | PRN

## 2011-12-06 MED ORDER — SODIUM CHLORIDE 0.9 % IJ SOLN: 3.0000 mL | Freq: Two times a day (BID) | INTRAMUSCULAR | Status: DC

## 2011-12-06 MED ORDER — FERROUS SULFATE 325 (65 FE) MG PO TABS
325.0000 mg | ORAL_TABLET | Freq: Two times a day (BID) | ORAL | Status: DC
  Administered 2011-12-06 – 2011-12-07 (×3): 325 mg via ORAL
  Filled 2011-12-06 (×3): qty 1

## 2011-12-06 MED ORDER — SIMETHICONE 80 MG PO CHEW: 80.0000 mg | CHEWABLE_TABLET | ORAL | Status: DC | PRN

## 2011-12-06 MED ORDER — MEASLES, MUMPS & RUBELLA VAC ~~LOC~~ INJ
0.5000 mL | INJECTION | Freq: Once | SUBCUTANEOUS | Status: DC
  Filled 2011-12-06: qty 0.5

## 2011-12-06 MED ORDER — SENNOSIDES-DOCUSATE SODIUM 8.6-50 MG PO TABS
2.0000 | ORAL_TABLET | Freq: Every day | ORAL | Status: DC
  Administered 2011-12-06: 2 via ORAL

## 2011-12-06 MED ORDER — DIBUCAINE 1 % RE OINT: 1.0000 "application " | TOPICAL_OINTMENT | RECTAL | Status: DC | PRN

## 2011-12-06 MED ORDER — SODIUM CHLORIDE 0.9 % IV SOLN: 250.0000 mL | INTRAVENOUS | Status: DC | PRN

## 2011-12-06 NOTE — Progress Notes (Signed)
Patient is eating, ambulating, voiding.  Pain control is good.  Filed Vitals:   12/06/11 0100 12/06/11 0135 12/06/11 0215 12/06/11 0315  BP: 92/52 114/64 117/77 104/62  Pulse: 98 93 97 97  Temp:   98.2 F (36.8 C) 98.8 F (37.1 C)  TempSrc:   Oral Oral  Resp:   18 18  Height:      Weight:      SpO2:   96% 95%    Fundus firm Perineum without swelling.  Lab Results  Component Value Date   WBC 18.0* 12/06/2011   HGB 9.5* 12/06/2011   HCT 28.2* 12/06/2011   MCV 94.0 12/06/2011   PLT 169 12/06/2011    --/--/O NEG, O NEG (07/05 1635)/RI  A/P Post partum day 1.  Routine care.  Expect d/c tomorrow.  Parents desire circ- all risks d/w them.  Teneka Malmberg A

## 2011-12-06 NOTE — Anesthesia Postprocedure Evaluation (Signed)
  Anesthesia Post-op Note  Patient: Lauren Austin  Procedure(s) Performed: * No procedures listed *  Patient Location: Mother/Baby  Anesthesia Type: Epidural  Level of Consciousness: awake, alert  and oriented  Airway and Oxygen Therapy: Patient Spontanous Breathing  Post-op Pain: mild  Post-op Assessment: Patient's Cardiovascular Status Stable, Respiratory Function Stable, Patent Airway, No signs of Nausea or vomiting and Pain level controlled  Post-op Vital Signs: stable  Complications: No apparent anesthesia complications

## 2011-12-07 NOTE — Progress Notes (Signed)
PPD#2 Pt without c/o. Ready for discharge 

## 2011-12-07 NOTE — Discharge Summary (Signed)
Obstetric Discharge Summary Reason for Admission: onset of labor Prenatal Procedures: ultrasound Intrapartum Procedures: vacuum Postpartum Procedures: none Complications-Operative and Postpartum: 3 degree perineal laceration Hemoglobin  Date Value Range Status  12/06/2011 9.5* 12.0 - 15.0 g/dL Final     DELTA CHECK NOTED     REPEATED TO VERIFY     HCT  Date Value Range Status  12/06/2011 28.2* 36.0 - 46.0 % Final    Physical Exam:  General: alert Lochia: appropriate Uterine Fundus: firm  Discharge Diagnoses: Term Pregnancy-delivered  Discharge Information: Date: 12/07/2011 Activity: pelvic rest Diet: routine Medications: PNV Condition: stable Instructions: refer to practice specific booklet Discharge to: home Follow-up Information    Follow up with HORVATH,MICHELLE A, MD. Schedule an appointment as soon as possible for a visit in 4 weeks.   Contact information:   [address] [place] [address] [phone]          Newborn Data: Live born female  Birth Weight: 6 lb 11.1 oz (3035 g) APGAR: 9, 9  Home with mother.  ANDERSON,MARK E 12/07/2011, 10:05 AM

## 2012-06-10 ENCOUNTER — Encounter (INDEPENDENT_AMBULATORY_CARE_PROVIDER_SITE_OTHER): Payer: Managed Care, Other (non HMO)

## 2012-06-16 IMAGING — US US OB FOLLOW-UP
1 series · 12 of 28 positions shown · non-contrast
Comparison: none

[Series 1: us ob follow-up · 12 of 43 slices shown]
[im 2/43]
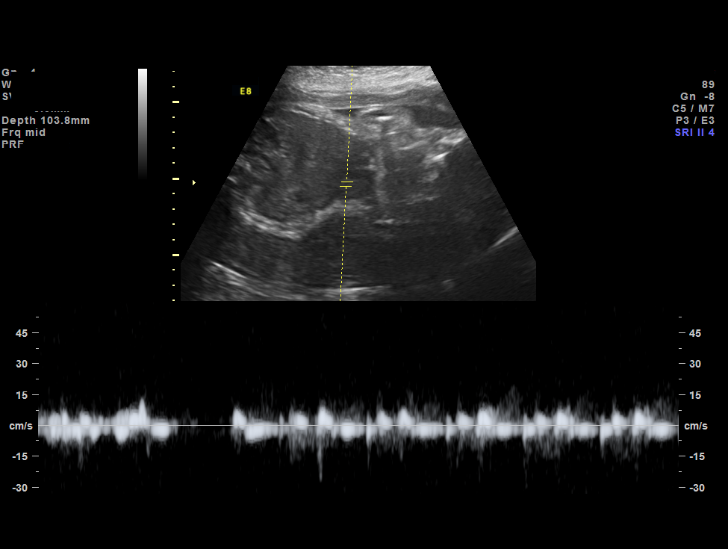
[im 5/43]
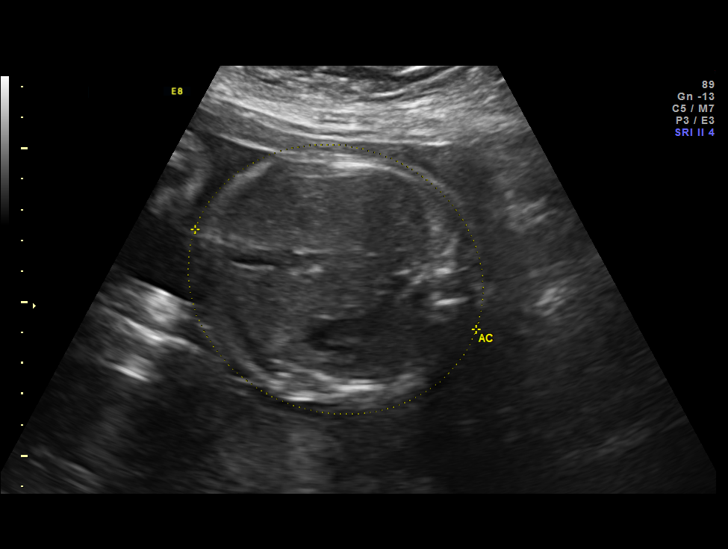
[im 8/43]
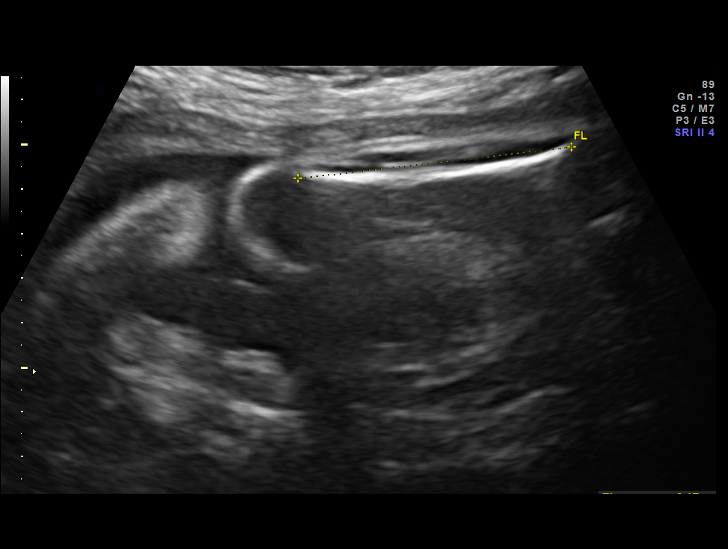
[im 13/43]
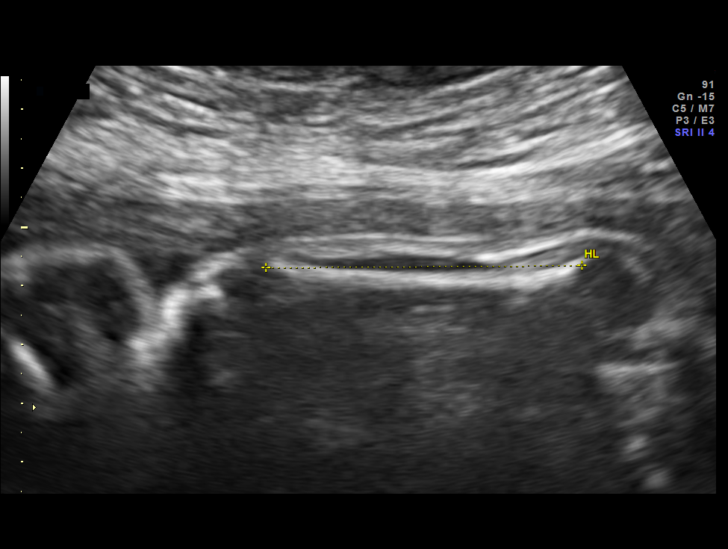
[im 16/43]
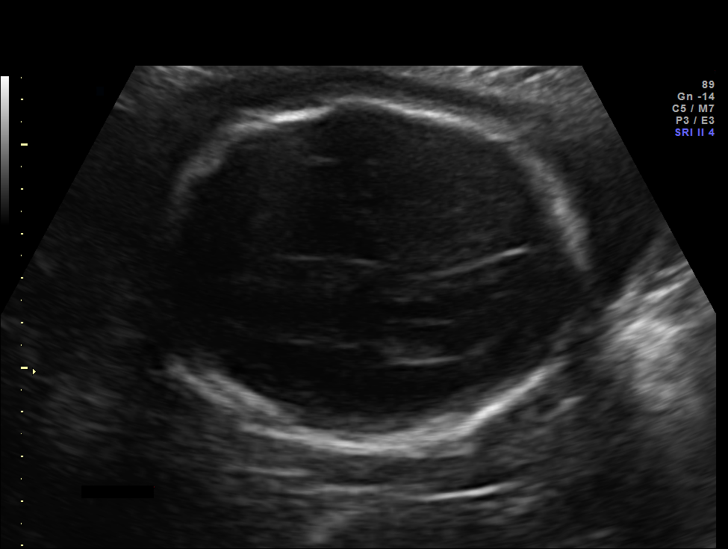
[im 19/43]
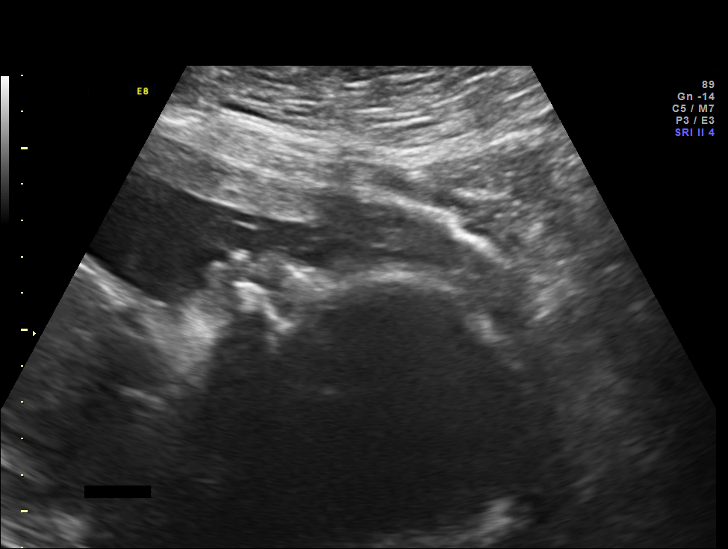
[im 24/43]
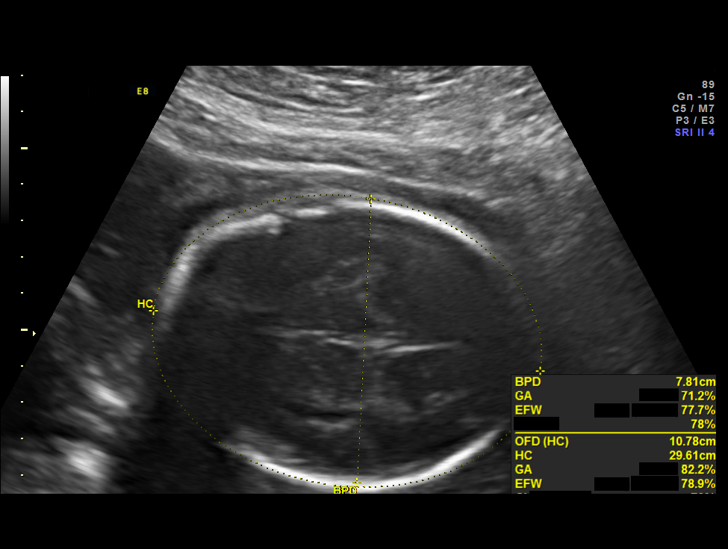
[im 27/43]
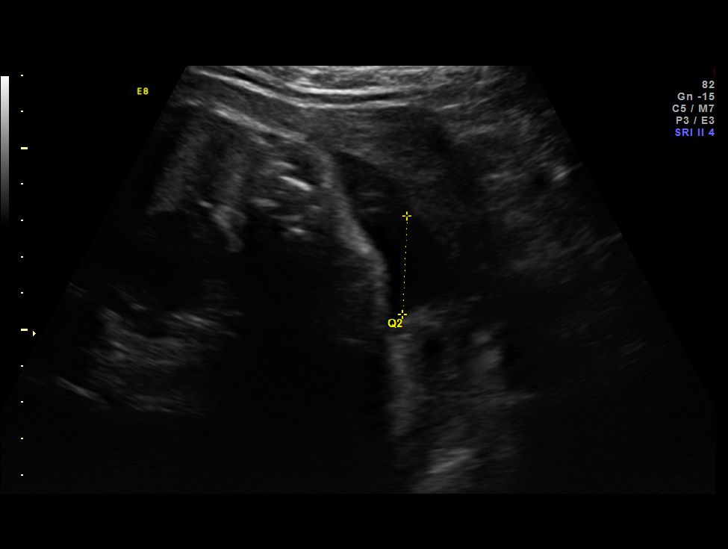
[im 30/43]
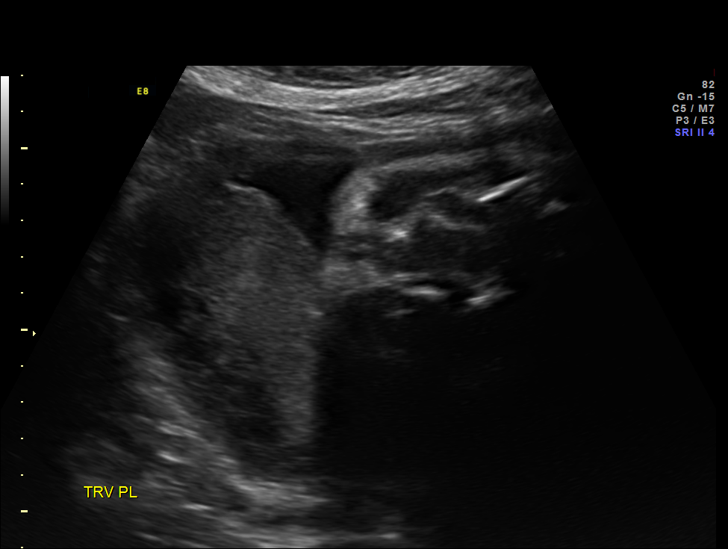
[im 35/43]
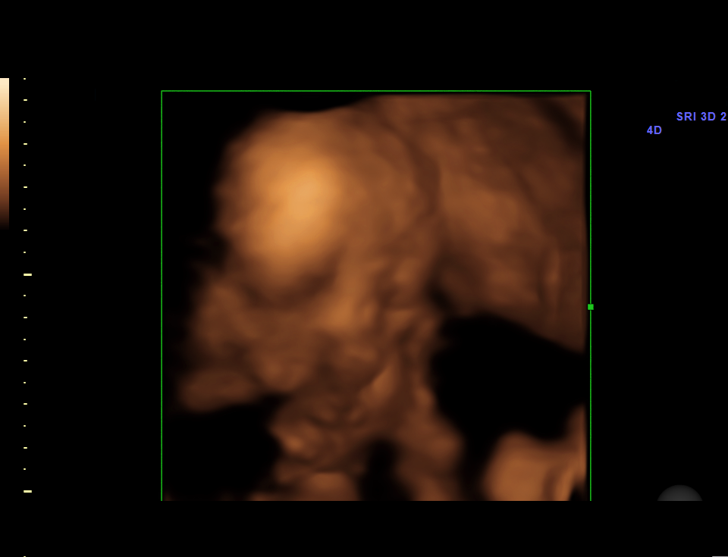
[im 38/43]
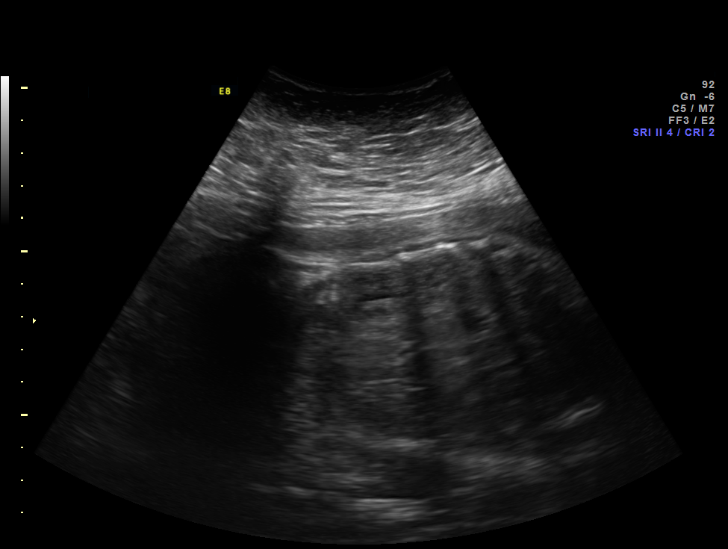
[im 41/43]
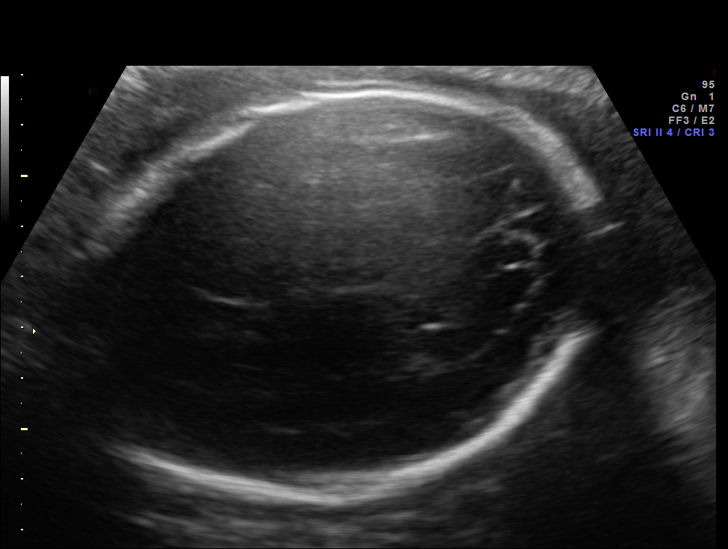

[12 of 28 positions shown; findings below may reference images not displayed]

OBSTETRICS REPORT
                      (Signed Final 10/13/2011 [DATE])

 Order#:         [id number]
Procedures

 US OB FOLLOW UP                                       76816.1
Indications

 Advanced maternal age (AMA), Primigravida (S/P
 Alavi, Chandra Kant)
 Obesity (288 lbs)
 Assess Fetal Growth / Estimated Fetal Weight
Fetal Evaluation

 Fetal Heart Rate:  141                          bpm
 Cardiac Activity:  Observed
 Presentation:      Cephalic
 Placenta:          Posterior, above cervical
                    os
 P. Cord            Previously Visualized
 Insertion:

 Amniotic Fluid
 AFI FV:      Subjectively within normal limits
 AFI Sum:     13.47   cm       42  %Tile     Larg Pckt:    5.58  cm
 RUQ:   5.58    cm   RLQ:    2.71   cm    LUQ:   2.57    cm   LLQ:    2.61   cm
Biometry

 BPD:     77.5  mm     G. Age:  31w 1d                CI:         74.3   70 - 86
 OFD:    104.3  mm                                    FL/HC:      21.0   19.2 -

 HC:     290.3  mm     G. Age:  32w 0d       63  %    HC/AC:      1.02   0.99 -

 AC:     284.5  mm     G. Age:  32w 3d       94  %    FL/BPD:     78.6   71 - 87
 FL:      60.9  mm     G. Age:  31w 5d       73  %    FL/AC:      21.4   20 - 24
 HUM:     54.6  mm     G. Age:  31w 5d       79  %

 Est. FW:    2396  gm      4 lb 3 oz     80  %
Gestational Age

 LMP:           31w 2d        Date:  03/08/11                 EDD:   12/13/11
 U/S Today:     31w 6d                                        EDD:   12/09/11
 Best:          30w 2d     Det. By:  Early Ultrasound         EDD:   12/20/11
                                     (05/06/11)
Anatomy

 Cranium:           Appears normal      Aortic Arch:       Appears normal
 Fetal Cavum:       Previously seen     Ductal Arch:       Not well
                                                           visualized
 Ventricles:        Appears normal      Diaphragm:         Appears normal
 Choroid Plexus:    Previously seen     Stomach:           Appears
                                                           normal, left
                                                           sided
 Cerebellum:        Previously seen     Abdomen:           Previously seen
 Posterior Fossa:   Previously seen     Abdominal Wall:    Previously seen
 Nuchal Fold:       Previously seen     Cord Vessels:      Previously seen
 Face:              Appears normal      Kidneys:           Appear normal
                    (lips/profile/orbit
                    s)
 Heart:             Previously seen     Bladder:           Appears normal
 RVOT:              Previously seen     Spine:             Previously seen
 LVOT:              Previously seen     Limbs:             Previously seen

 Other:     Male gender.
Cervix Uterus Adnexa

 Cervix:       Not visualized (advanced GA >29 wks)
Impression

 IUP at 30+2 weeks
 Normal interval anatomy; anatomic survey complete except
 for DA
 Normal amniotic fluid volume
 Appropriate interval growth with EFW at the 80th %tile
Recommendations

 Follow-up ultrasound for growth in 4-5 weeks

 questions or concerns.

## 2012-06-17 ENCOUNTER — Encounter (INDEPENDENT_AMBULATORY_CARE_PROVIDER_SITE_OTHER): Payer: Self-pay

## 2012-06-17 ENCOUNTER — Ambulatory Visit (INDEPENDENT_AMBULATORY_CARE_PROVIDER_SITE_OTHER): Payer: Managed Care, Other (non HMO) | Admitting: Physician Assistant

## 2012-06-17 VITALS — BP 118/72 | HR 74 | Resp 16 | Ht 63.0 in | Wt 283.6 lb

## 2012-06-17 DIAGNOSIS — Z4651 Encounter for fitting and adjustment of gastric lap band: Secondary | ICD-10-CM

## 2012-06-17 NOTE — Patient Instructions (Signed)
Take clear liquids tonight. Thin protein shakes are ok to start tomorrow morning. Slowly advance your diet thereafter. Call us if you have persistent vomiting or regurgitation, night cough or reflux symptoms. Return as scheduled or sooner if you notice no changes in hunger/portion sizes.  

## 2012-06-17 NOTE — Progress Notes (Signed)
  HISTORY: Lauren Austin is a 42 y.o.female who received an 10cm lap-band in May 2007 by Dr. Johna Sheriff. She comes in with increasing hunger and portion sizes. Her last visit/fill was October 2012. She's since had a baby. She denies persistent regurgitation or reflux. She would like a fill today.  VITAL SIGNS: Filed Vitals:   06/17/12 1017  BP: 118/72  Pulse: 74  Resp: 16    PHYSICAL EXAM: Physical exam reveals a very well-appearing 42 y.o.female in no apparent distress Neurologic: Awake, alert, oriented Psych: Bright affect, conversant Respiratory: Breathing even and unlabored. No stridor or wheezing Abdomen: Soft, nontender, nondistended to palpation. Incisions well-healed. No incisional hernias. Port easily palpated. Extremities: Atraumatic, good range of motion.  ASSESMENT: 42 y.o.  female  s/p 10cm lap-band.   PLAN: The patient's port was accessed with a 20G Huber needle without difficulty. Clear fluid was aspirated and 0.25 mL saline was added to the port to give a total predicted volume of 3.75 mL. The patient was able to swallow water without difficulty following the procedure and was instructed to take clear liquids for the next 24-48 hours and advance slowly as tolerated.

## 2012-06-21 ENCOUNTER — Telehealth (INDEPENDENT_AMBULATORY_CARE_PROVIDER_SITE_OTHER): Payer: Self-pay | Admitting: General Surgery

## 2012-06-21 NOTE — Telephone Encounter (Signed)
Pt called in to let us know that she had a lap band fill by Mardelle Matte last Thursday and she is now having sever reflux problems.  She is able to keep down food and water but she explained that she is very uncomfortable and having problems laying down because of these problems.  I informed her the first appt we had available to work her in with Dr. Johna Sheriff would be Thursday 06/24/12.  She said that was fine.

## 2012-06-24 ENCOUNTER — Ambulatory Visit (INDEPENDENT_AMBULATORY_CARE_PROVIDER_SITE_OTHER): Payer: Managed Care, Other (non HMO) | Admitting: General Surgery

## 2012-06-24 ENCOUNTER — Encounter (INDEPENDENT_AMBULATORY_CARE_PROVIDER_SITE_OTHER): Payer: Self-pay | Admitting: General Surgery

## 2012-06-24 DIAGNOSIS — Z6841 Body Mass Index (BMI) 40.0 and over, adult: Secondary | ICD-10-CM

## 2012-06-24 NOTE — Progress Notes (Signed)
Chief complaint: Followup lap band reflux  History: Patient returns with history of lap band placement in 2007. She had a fill by and the last week but since then has had persistent severe reflux and water brash at night.  Exam: BP 136/78  Pulse 74  Temp 98 F (36.7 C)  Resp 18  Ht 5\' 3"  (1.6 m)  Wt 278 lb (126.1 kg)  BMI 49.25 kg/m2 Total weight lost 54 pounds, 5 pounds since last visit  Abdomen is nontender and port site looks fine  Assessment and plan: She is on restricted. With this information we took Quarter cc was added last week..  She has not been exercising and get back into this as the weather warms up. She had a baby in July. Review diet exercise tragedies. She'sTo return in 6 months.

## 2012-12-16 ENCOUNTER — Encounter (INDEPENDENT_AMBULATORY_CARE_PROVIDER_SITE_OTHER): Payer: Managed Care, Other (non HMO)

## 2012-12-30 ENCOUNTER — Encounter (INDEPENDENT_AMBULATORY_CARE_PROVIDER_SITE_OTHER): Payer: Managed Care, Other (non HMO)

## 2013-01-27 ENCOUNTER — Encounter (INDEPENDENT_AMBULATORY_CARE_PROVIDER_SITE_OTHER): Payer: Managed Care, Other (non HMO)

## 2013-02-02 ENCOUNTER — Other Ambulatory Visit: Payer: Self-pay

## 2013-09-28 ENCOUNTER — Ambulatory Visit (INDEPENDENT_AMBULATORY_CARE_PROVIDER_SITE_OTHER): Payer: Managed Care, Other (non HMO) | Admitting: Surgery

## 2013-09-28 ENCOUNTER — Encounter (INDEPENDENT_AMBULATORY_CARE_PROVIDER_SITE_OTHER): Payer: Self-pay | Admitting: Surgery

## 2013-09-28 VITALS — BP 125/75 | HR 79 | Temp 98.2°F | Resp 14 | Ht 62.0 in | Wt 290.8 lb

## 2013-09-28 DIAGNOSIS — Z6841 Body Mass Index (BMI) 40.0 and over, adult: Secondary | ICD-10-CM

## 2013-09-28 DIAGNOSIS — K9509 Other complications of gastric band procedure: Secondary | ICD-10-CM

## 2013-09-28 NOTE — Progress Notes (Signed)
Chief Complaint:  Failure to lose weight with Lapband 10 cm  History of Present Illness:  Lauren PhillipsMonica L Austin is an 43 y.o. female who underwent Lapband by Dr. Johna SheriffHoxworth and Colin BentonEarle in May 2007.  Since then when lost from a preop weight of 332 down to a low of 250 and an average weight of 275.  Current BMI is 53.  She has been unable to succeed in getting her BMI down although with her lapband, her DM has cleared.  We discussed sleeve and bypass and she prefers the former.    She expressed a desire to change from Dr. Johna SheriffHoxworth to me over some subjective issues.    Past Medical History  Diagnosis Date  . Family history of ovarian cancer   . Abnormal Pap smear   . Obesity     Past Surgical History  Procedure Laterality Date  . Laparoscopic gastric banding  2007  . Cholecystectomy  2009  . Lap band      Current Outpatient Prescriptions  Medication Sig Dispense Refill  . buPROPion (WELLBUTRIN SR) 200 MG 12 hr tablet Take 250 mg by mouth once.      . cyclobenzaprine (FLEXERIL) 5 MG tablet       . naproxen (NAPROSYN) 500 MG tablet        No current facility-administered medications for this visit.   Review of patient's allergies indicates no known allergies. Family History  Problem Relation Age of Onset  . Cancer Maternal Grandfather     bone   Social History:   reports that she has never smoked. She has never used smokeless tobacco. She reports that she drinks alcohol. She reports that she does not use illicit drugs.   REVIEW OF SYSTEMS - PERTINENT POSITIVES ONLY: Negative for DVT  Physical Exam:   Blood pressure 125/75, pulse 79, temperature 98.2 F (36.8 C), resp. rate 14, height 5\' 2"  (1.575 m), weight 290 lb 12.8 oz (131.906 kg). Body mass index is 53.17 kg/(m^2).  Gen:  WDWN WF NAD  Neurological: Alert and oriented to person, place, and time. Motor and sensory function is grossly intact  Head: Normocephalic and atraumatic.  Eyes: Conjunctivae are normal. Pupils are equal, round,  and reactive to light. No scleral icterus.  Neck: Normal range of motion. Neck supple. No tracheal deviation or thyromegaly present.  Cardiovascular:  SR without murmurs or gallops.  No carotid bruits Respiratory: Effort normal.  No respiratory distress. No chest wall tenderness. Breath sounds normal.  No wheezes, rales or rhonchi.  Abdomen:  nontender GU: Musculoskeletal: Normal range of motion. Extremities are nontender. No cyanosis, edema or clubbing noted Lymphadenopathy: No cervical, preauricular, postauricular or axillary adenopathy is present Skin: Skin is warm and dry. No rash noted. No diaphoresis. No erythema. No pallor. Pscyh: Normal mood and affect. Behavior is normal. Judgment and thought content normal.   LABORATORY RESULTS: No results found for this or any previous visit (from the past 48 hour(s)).  RADIOLOGY RESULTS: No results found.  Problem List: Patient Active Problem List   Diagnosis Date Noted  . Morbid obesity 06/24/2012  . Active labor 12/05/2011  . Decreased fetal movement 12/05/2011    Assessment & Plan: Failure to achieve weight loss with Lapband and would like to have band removed and conversion to a gastric sleeve.  I have discussed this operation as either a long hard operation or in 2 parts depending on the degree of difficulty in removing the band. I also explained the risk of leaks  along this long staple line and other complications. Will move forward with certification of conversion from band to gastric sleeve    Matt B. Daphine DeutscherMartin, MD, [facility]  [facility] [phone] beeper [phone]  09/28/2013 3:25 PM

## 2013-09-28 NOTE — Patient Instructions (Signed)
Sleeve Gastrectomy A sleeve gastrectomy is a surgery in which a large portion of the stomach is removed. After the surgery, the stomach will be a narrow tube about the size of a banana. This surgery is performed to help a person lose weight. The person loses weight because the reduced size of the stomach restricts the amount of food that the person can eat. The stomach will hold much less food than before the surgery. Also, the part of the stomach that is removed produces a hormone that causes hunger.  This surgery is done for people who have morbid obesity, defined as a body mass index (BMI) greater than 40. BMI is an estimate of body fat and is calculated from the height and weight of a person. This surgery may also be done for people with a BMI between 35 and 40 if they have other diseases, such as type 2 diabetes mellitus, obstructive sleep apnea, or heart and lung disorders (cardiopulmonary diseases).  LET YOUR HEALTH CARE PROVIDER KNOW ABOUT:  Any allergies you have.   All medicines you are taking, including vitamins, herbs, eyedrops, creams, and over-the-counter medicines.   Use of steroids (by mouth or creams).   Previous problems you or members of your family have had with the use of anesthetics.   Any blood disorders you have.   Previous surgeries you have had.   Possibility of pregnancy, if this applies.   Other health problems you have. RISKS AND COMPLICATIONS Generally, sleeve gastrectomy is a safe procedure. However, as with any procedure, complications can occur. Possible complications include:  Infection.  Bleeding.  Blood clots.  Damage to other organs or tissue.  Leakage of fluid from the stomach into the abdominal cavity (rare). BEFORE THE PROCEDURE  You may need to have blood tests and imaging tests (such as X-rays or ultrasonography) done before the day of surgery. A test to evaluate your esophagus and how it moves (esophageal manometry) may also be  done.  You may be placed on a liquid diet 2 3 weeks before the surgery.  Ask your health care provider about changing or stopping your regular medicines.  Do not eat or drink anything for at least 8 hours before the procedure.   Make plans to have someone drive you home after your hospital stay. Also arrange for someone to help you with activities during recovery. PROCEDURE  A laparoscopic technique is usually used for this surgery:  You will be given medicine to make you sleep through the procedure (general anesthetic). This medicine will be given through an intravenous (IV) access tube that is put into one of your veins.  Once you are asleep, your abdomen will be cleaned and sterilized.  Several small incisions will be made in your abdomen.  Your abdomen will be filled with air so that it expands. This gives the surgeon more room to operate and makes your organs easier to see.  A thin, lighted tube with a tiny camera on the end (laparoscope) is put through a small incision in your abdomen. The camera on the laparoscope sends a picture to a TV screen in the operating room. This gives the surgeon a good view inside the abdomen.  Hollow tubes are put through the other small incisions in your abdomen. The tools needed for the procedure are put through these tubes.  The surgeon uses staples to divide part of the stomach and then removes it through one of the incisions.  The remaining stomach may be reinforced using   stitches or surgical glue or both to prevent leakage of the stomach contents. A small tube (drain) may be placed through one of the incisions to allow extra fluid to flow from the area.  The incisions are closed with stitches, staples, or glue. AFTER THE PROCEDURE  You will be monitored closely in a recovery area. Once the anesthetic has worn off, you will likely be moved to a regular hospital room.  You will be given medicine for pain and nausea.   You may have a drain  from one of the incisions in your abdomen. If a drain is used, it may stay in place after you go home from the hospital and be removed at a follow-up appointment.   You will be encouraged to walk around several times a day. This helps prevent blood clots.  You will be started on a liquid diet the first day after your surgery. Sometimes a test is done to check for leaking before you can eat.  You will be urged to cough and do deep breathing exercises. This helps prevent a lung infection after a surgery.  You will likely need to stay in the hospital for a few days.  Document Released: 03/16/2009 Document Revised: 01/19/2013 Document Reviewed: 10/01/2012 [employer] Patient Information 2014 [employer].  

## 2013-09-28 NOTE — Addendum Note (Signed)
Addended by: Maryan PulsMOORE, Krisi Azua on: 09/28/2013 03:47 PM   Modules accepted: Orders

## 2013-10-11 ENCOUNTER — Ambulatory Visit (HOSPITAL_COMMUNITY): Payer: Managed Care, Other (non HMO) | Attending: Surgery

## 2013-10-11 ENCOUNTER — Ambulatory Visit (HOSPITAL_COMMUNITY): Admission: RE | Admit: 2013-10-11 | Payer: Managed Care, Other (non HMO) | Source: Ambulatory Visit

## 2013-10-11 ENCOUNTER — Ambulatory Visit (HOSPITAL_COMMUNITY): Payer: Managed Care, Other (non HMO)

## 2013-10-26 ENCOUNTER — Ambulatory Visit: Payer: Managed Care, Other (non HMO) | Admitting: Dietician

## 2014-04-03 ENCOUNTER — Encounter (INDEPENDENT_AMBULATORY_CARE_PROVIDER_SITE_OTHER): Payer: Self-pay | Admitting: Surgery

## 2014-07-27 ENCOUNTER — Ambulatory Visit
Admission: RE | Admit: 2014-07-27 | Discharge: 2014-07-27 | Disposition: A | Discharge status: Home / Self Care | Payer: 59 | Source: Ambulatory Visit | Attending: Physician Assistant | Admitting: Physician Assistant

## 2014-07-27 ENCOUNTER — Other Ambulatory Visit (INDEPENDENT_AMBULATORY_CARE_PROVIDER_SITE_OTHER): Payer: Self-pay | Admitting: Physician Assistant

## 2014-07-27 DIAGNOSIS — Z6841 Body Mass Index (BMI) 40.0 and over, adult: Principal | ICD-10-CM

## 2014-07-27 DIAGNOSIS — Z4651 Encounter for fitting and adjustment of gastric lap band: Secondary | ICD-10-CM

## 2014-08-03 ENCOUNTER — Other Ambulatory Visit (INDEPENDENT_AMBULATORY_CARE_PROVIDER_SITE_OTHER): Payer: Self-pay | Admitting: *Deleted

## 2014-08-03 DIAGNOSIS — R110 Nausea: Secondary | ICD-10-CM

## 2014-08-03 DIAGNOSIS — Z9884 Bariatric surgery status: Secondary | ICD-10-CM

## 2014-08-04 ENCOUNTER — Ambulatory Visit
Admission: RE | Admit: 2014-08-04 | Discharge: 2014-08-04 | Disposition: A | Discharge status: Home / Self Care | Payer: 59 | Source: Ambulatory Visit | Attending: Physician Assistant | Admitting: Physician Assistant

## 2015-04-08 IMAGING — RF DG UGI W/ KUB
16 of 24 series · 16 of 24 positions shown · non-contrast
Comparison: Multiple exams, including 07/27/2014

CLINICAL DATA: Laparoscopic adjustable gastric banding.  Nausea.

EXAM:
UPPER GI SERIES WITH KUB
TECHNIQUE: After obtaining a scout radiograph a routine upper GI series was
performed using thin barium.
FLUOROSCOPY TIME:  Radiation Exposure Index (as provided by the
fluoroscopic device): 213 dGy cm^2

[Series 1: run · 1 of 7 slices shown (1 of 16)]
[im 1/7]
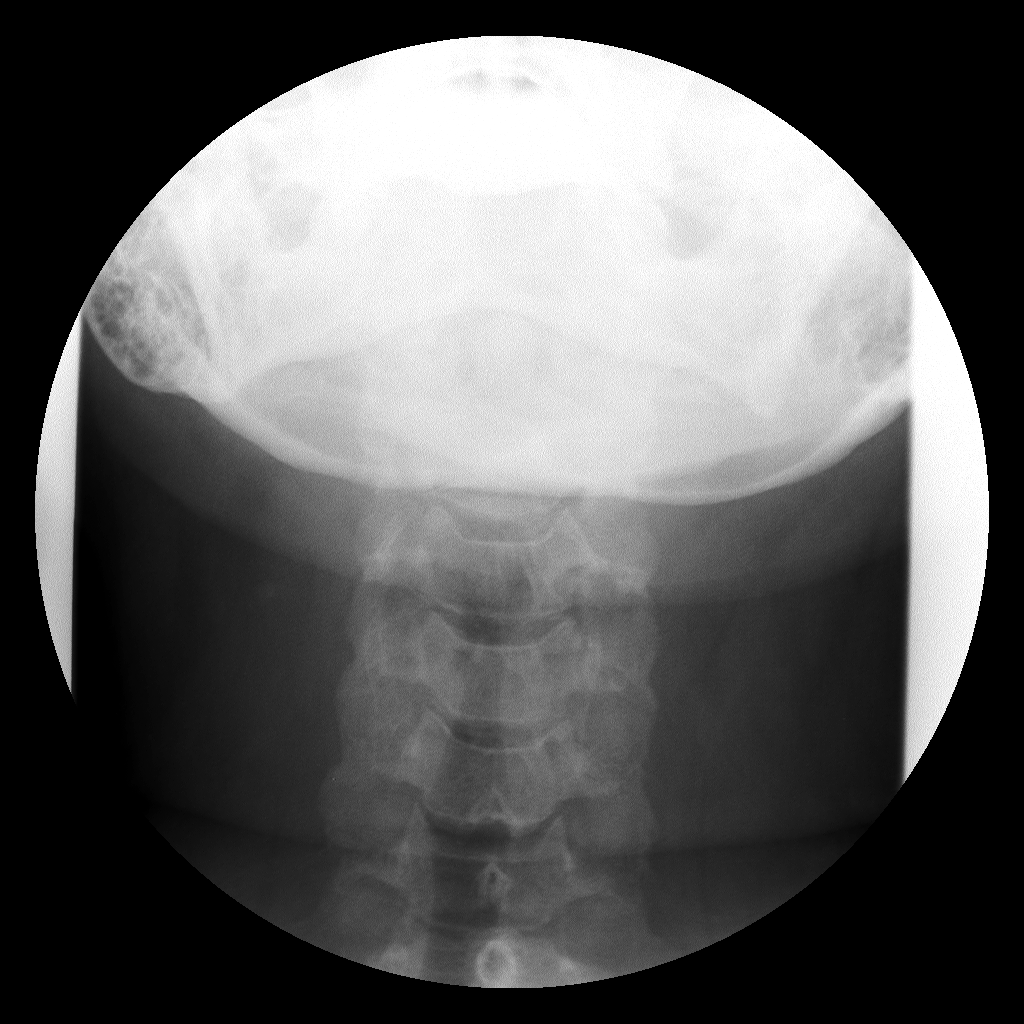

[Series 3: run · 1 of 1 slices shown (2 of 16)]
[im 1/1]
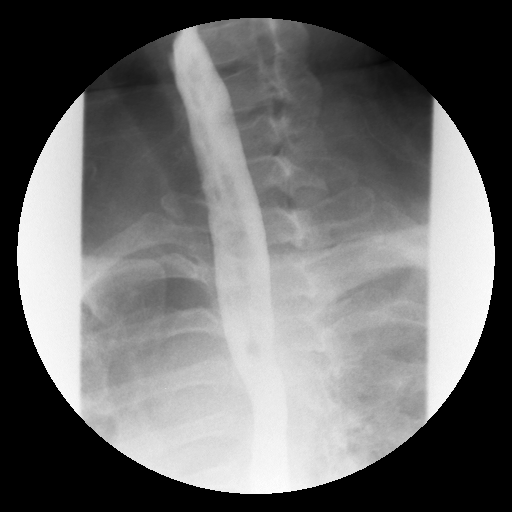

[Series 4: run · 1 of 1 slices shown (3 of 16)]
[im 1/1]
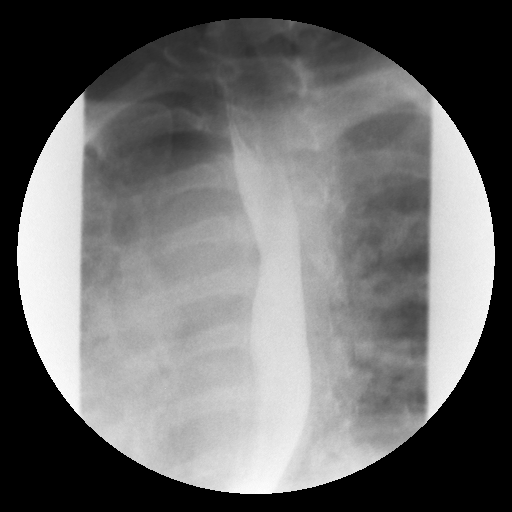

[Series 6: run · 1 of 1 slices shown (4 of 16)]
[im 1/1]
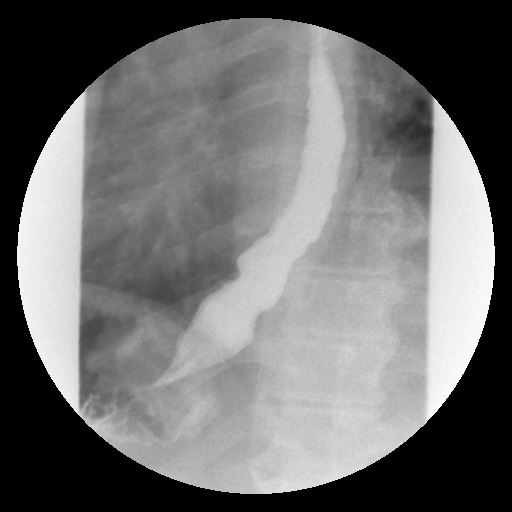

[Series 7: run · 1 of 1 slices shown (5 of 16)]
[im 1/1]
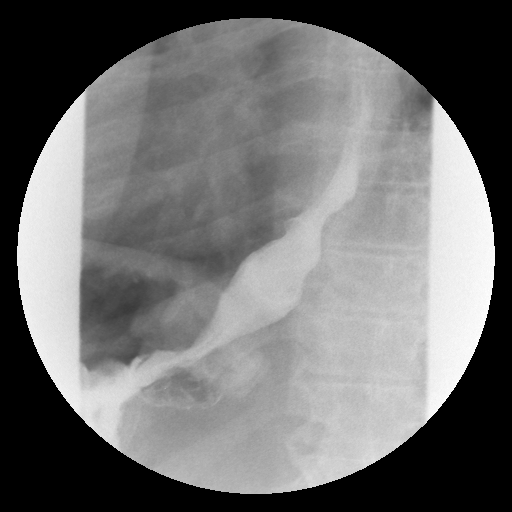

[Series 9: run · 1 of 1 slices shown (6 of 16)]
[im 1/1]
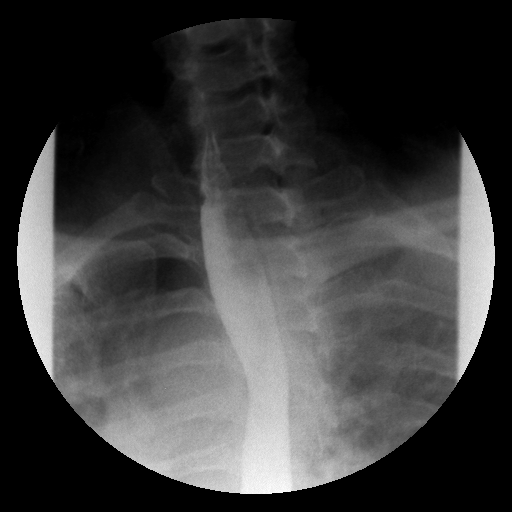

[Series 10: run · 1 of 1 slices shown (7 of 16)]
[im 1/1]
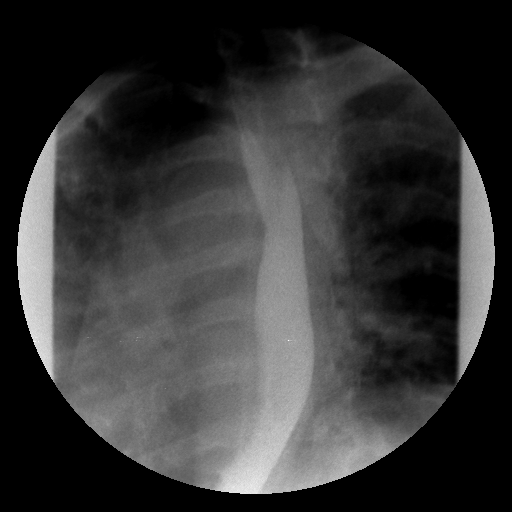

[Series 12: run · 1 of 1 slices shown (8 of 16)]
[im 1/1]
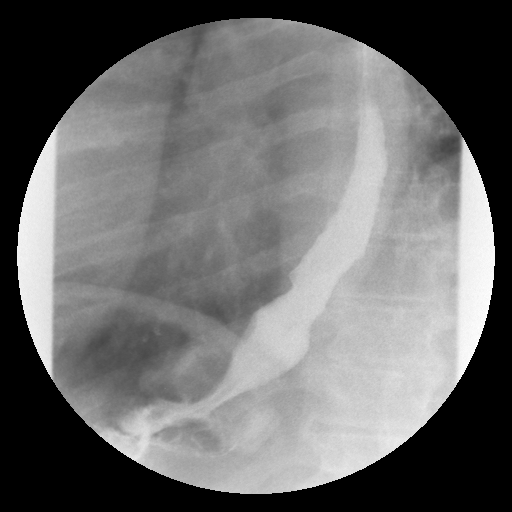

[Series 13: run · 1 of 1 slices shown (9 of 16)]
[im 1/1]
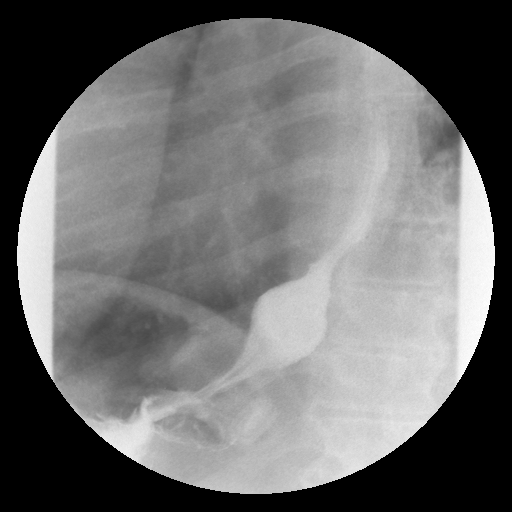

[Series 15: run · 1 of 1 slices shown (10 of 16)]
[im 1/1]
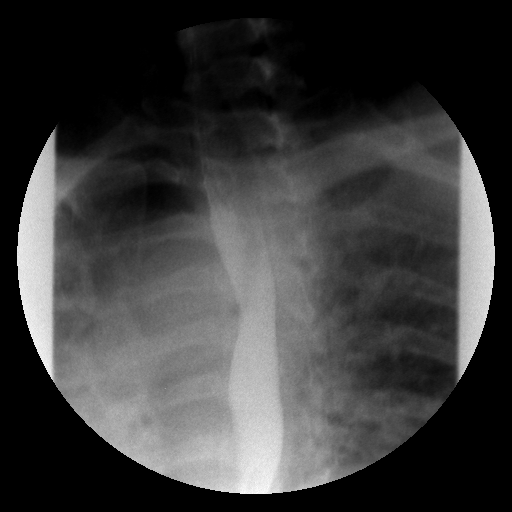

[Series 16: run · 1 of 1 slices shown (11 of 16)]
[im 1/1]
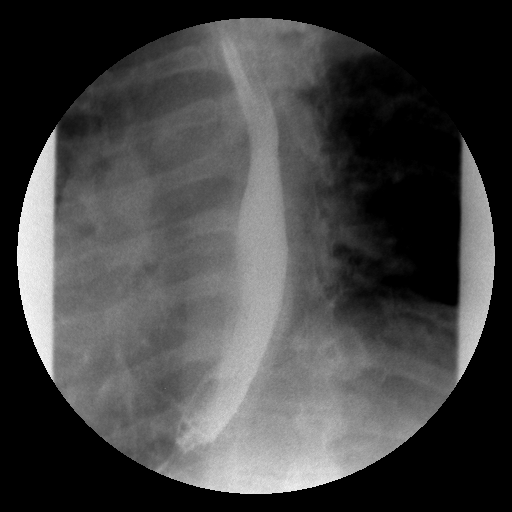

[Series 18: run · 1 of 1 slices shown (12 of 16)]
[im 1/1]
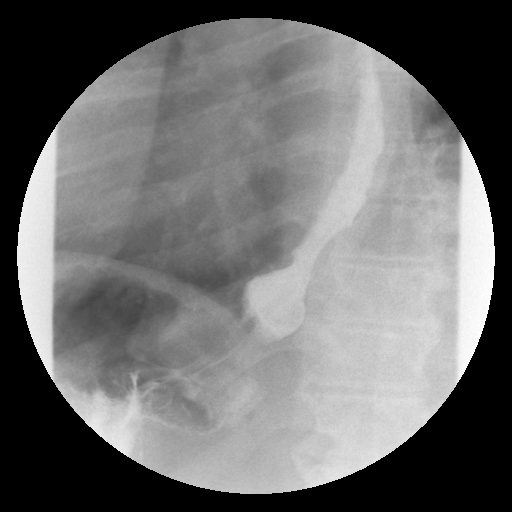

[Series 19: run · 1 of 1 slices shown (13 of 16)]
[im 1/1]
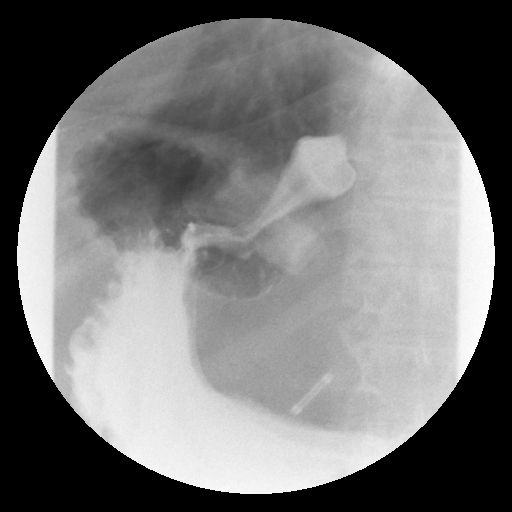

[Series 21: run · 1 of 1 slices shown (14 of 16)]
[im 1/1]
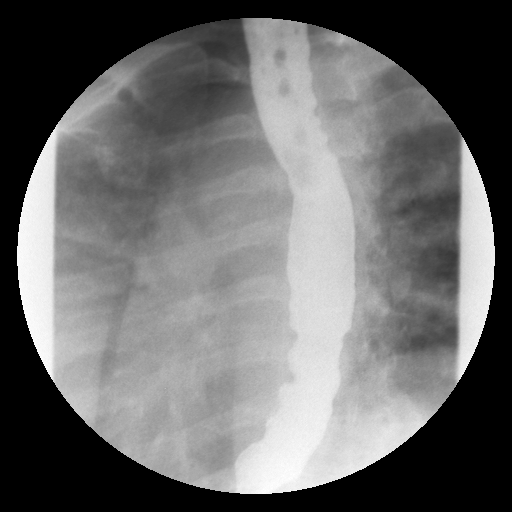

[Series 22: run · 1 of 1 slices shown (15 of 16)]
[im 1/1]
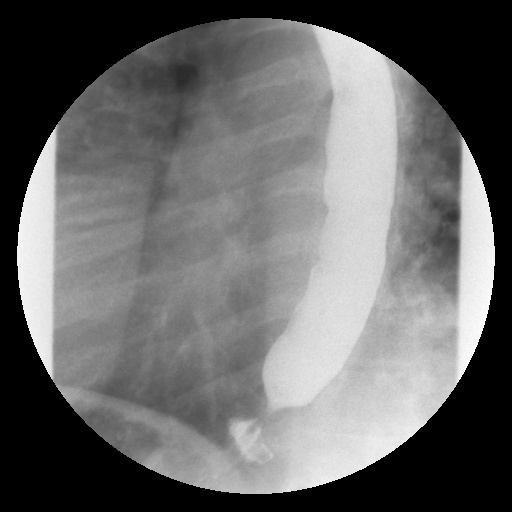

[Series 24: run · 1 of 1 slices shown (16 of 16)]
[im 1/1]
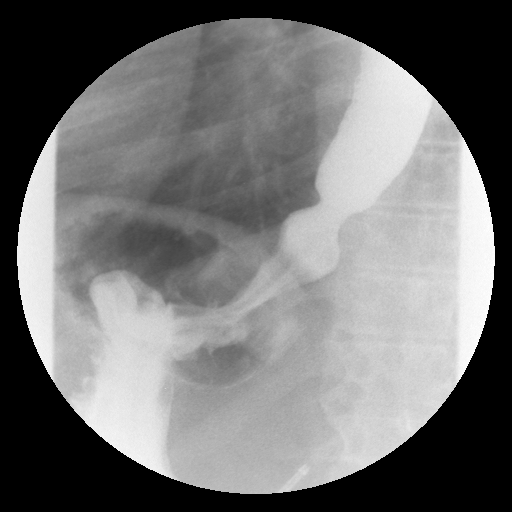

[16 of 24 positions shown; findings below may reference images not displayed]

FINDINGS: Initial KUB demonstrates unremarkable bowel gas pattern and a lap
band in appropriate position.

The pharyngeal phase of swallowing looks normal.

Primary peristaltic waves in the esophagus were normal on [DATE]
swallows. There were some secondary contractions on 1 swallow.

Small type 1 hiatal hernia.

The maximum directly measured luminal diameter in the vicinity of
the band is 9 mm on series 29. However, a 13 mm barium tablet passed
without difficulty beyond this band, and accordingly I suspect that
the true luminal diameter is larger than 9 mm.

CO2 gas crystals were not administered. I performed a single
contrast assessment of the stomach, although there was some original
gas in the stomach providing a partial double contrast assessment.

No significant gastric or duodenal abnormality is observed.
IMPRESSION: 1. Small type 1 hiatal hernia.
2. Gastric band is in correct position and orientation. Although the
directly demonstrated luminal diameter in the vicinity of the band
was only 9 mm, a 13 mm barium tablet did pass without difficulty
into the more distal portion of the stomach, indicating that the
true luminal diameter can accommodate the pill of this size.

## 2015-10-11 ENCOUNTER — Other Ambulatory Visit: Payer: Self-pay | Admitting: Obstetrics & Gynecology

## 2015-10-15 LAB — CYTOLOGY - PAP
